# Patient Record
Sex: Male | Born: 1950 | Race: Black or African American | Hispanic: No | Marital: Married | Smoking: Former smoker
Health system: Southern US, Community
[De-identification: ages and names within clinical notes are randomized; demographics above are authoritative.]

## PROBLEM LIST (undated history)

## (undated) DIAGNOSIS — N19 Unspecified kidney failure: Secondary | ICD-10-CM

## (undated) DIAGNOSIS — N400 Enlarged prostate without lower urinary tract symptoms: Secondary | ICD-10-CM

## (undated) DIAGNOSIS — I10 Essential (primary) hypertension: Secondary | ICD-10-CM

## (undated) DIAGNOSIS — E785 Hyperlipidemia, unspecified: Secondary | ICD-10-CM

## (undated) HISTORY — PX: COLONOSCOPY: SHX174

## (undated) HISTORY — DX: Benign prostatic hyperplasia without lower urinary tract symptoms: N40.0

## (undated) HISTORY — DX: Essential (primary) hypertension: I10

## (undated) HISTORY — PX: ACHILLES TENDON REPAIR: SUR1153

## (undated) HISTORY — DX: Unspecified kidney failure: N19

## (undated) HISTORY — DX: Hyperlipidemia, unspecified: E78.5

---

## 2002-09-01 ENCOUNTER — Ambulatory Visit (HOSPITAL_COMMUNITY): Admission: RE | Admit: 2002-09-01 | Discharge: 2002-09-01 | Payer: Self-pay | Admitting: Family Medicine

## 2002-09-01 ENCOUNTER — Encounter: Payer: Self-pay | Admitting: Family Medicine

## 2014-02-03 ENCOUNTER — Other Ambulatory Visit (HOSPITAL_COMMUNITY): Payer: Self-pay | Admitting: Urology

## 2014-02-03 DIAGNOSIS — C61 Malignant neoplasm of prostate: Secondary | ICD-10-CM

## 2014-02-22 ENCOUNTER — Ambulatory Visit (HOSPITAL_COMMUNITY): Admission: RE | Admit: 2014-02-22 | Payer: BC Managed Care – PPO | Source: Ambulatory Visit

## 2014-02-28 ENCOUNTER — Ambulatory Visit (HOSPITAL_COMMUNITY)
Admission: RE | Admit: 2014-02-28 | Discharge: 2014-02-28 | Disposition: A | Discharge status: Home / Self Care | Payer: BC Managed Care – PPO | Source: Ambulatory Visit | Attending: Urology | Admitting: Urology

## 2014-02-28 DIAGNOSIS — C61 Malignant neoplasm of prostate: Secondary | ICD-10-CM

## 2014-02-28 IMAGING — MR MR PROSTATE WO/W CM
49 of 54 series · 49 of 54 positions shown · IV contrast (multihance)
Comparison: Noncontrast CT on 09/20/2013

CLINICAL DATA: Recent TURP procedure revealed prostate carcinoma.
Gleason score 6.

EXAM:
MR PROSTATE WITHOUT AND WITH CONTRAST
TECHNIQUE: Multiplanar multisequence MRI images were obtained of the pelvis
centered about the prostate. Pre and post contrast images were
obtained.
CONTRAST:  20mL MULTIHANCE GADOBENATE DIMEGLUMINE 529 MG/ML IV SOLN

[Series 3: T1 · axial · 8.0mm · 0.78mm/px · 1 of 22 slices shown (1 of 2)]
[im 1/22]
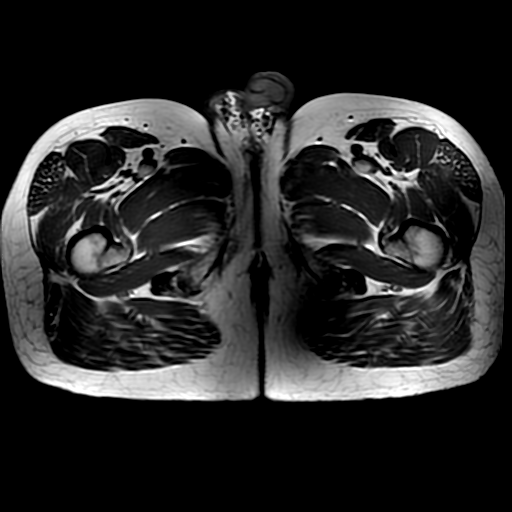

[Series 4: T2 · sagittal · 3.0mm · 0.35mm/px · 1 of 26 slices shown (1 of 3)]
[im 1/26]
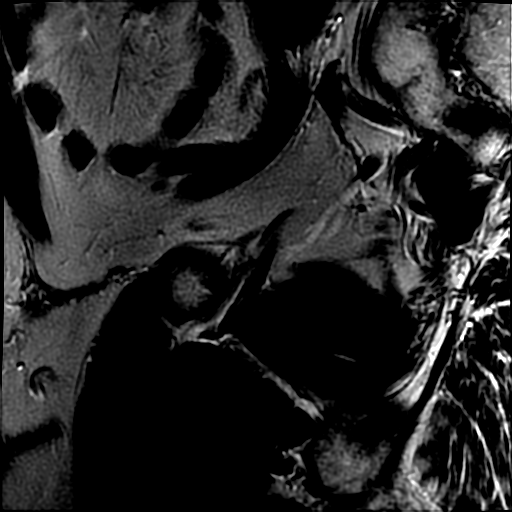

[Series 5: T1 · axial · 3.0mm · 0.33mm/px · 1 of 24 slices shown (2 of 2)]
[im 1/24]
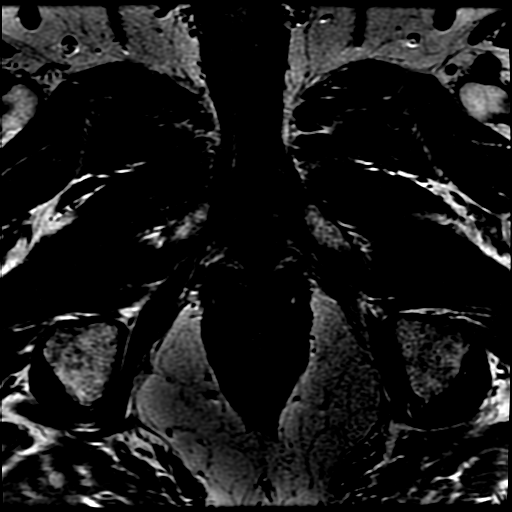

[Series 6: T2 · axial · 3.0mm · 0.33mm/px · 1 of 24 slices shown (2 of 3)]
[im 1/24]
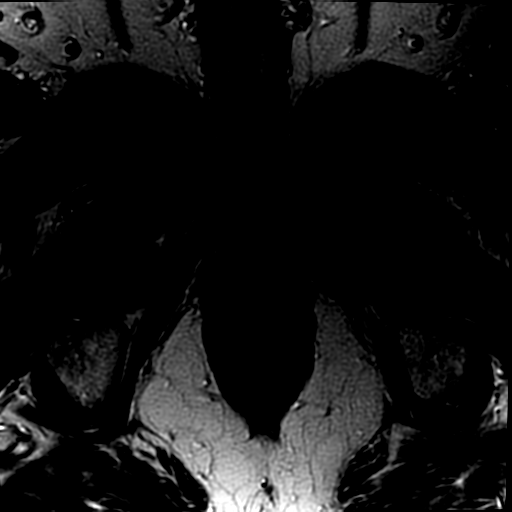

[Series 8: DWI · axial · 3.0mm · 0.70mm/px · 1 of 60 slices shown (1 of 2)]
[im 1/60]
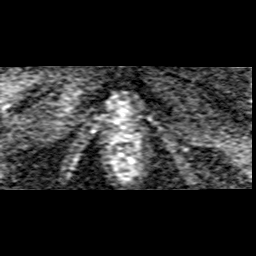

[Series 9: T2 · coronal · 3.0mm · 0.35mm/px · 1 of 18 slices shown (3 of 3)]
[im 1/18]
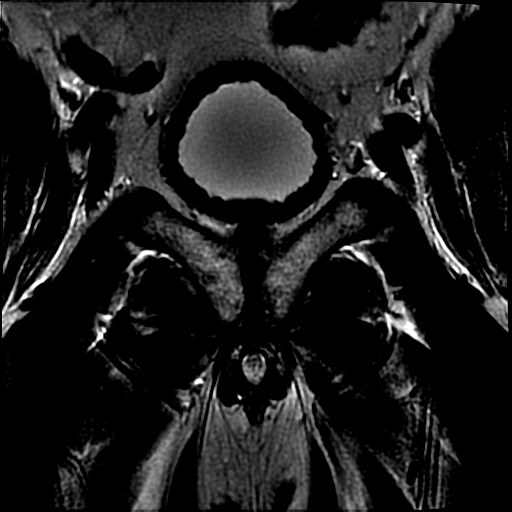

[Series 10: bSSFP fat-sat · axial · 8.0mm · 0.78mm/px · 1 of 22 slices shown]
[im 1/22]
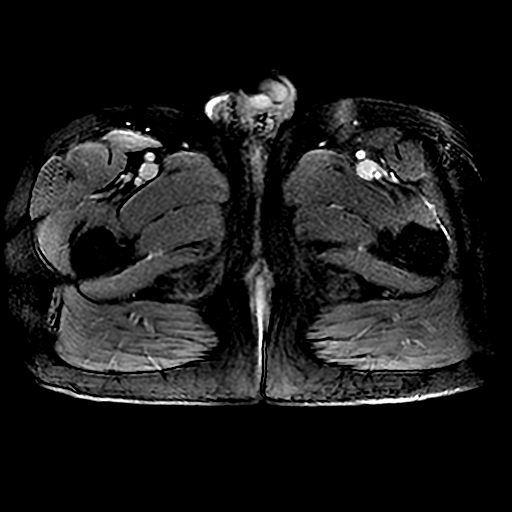

[Series 800: DWI · axial · 3.0mm · 0.70mm/px · 1 of 2 slices shown (2 of 2)]
[im 1/2]
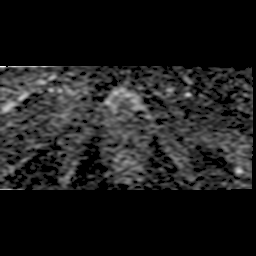

[Series 1100: T1 dynamic · axial · 4.0mm · 0.94mm/px · 1 of 26 slices shown (1 of 24)]
[im 1/26]
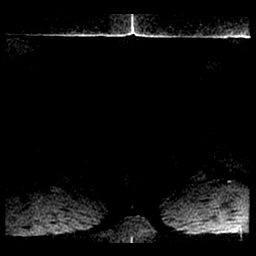

[Series 1101: T1 dynamic · axial · 4.0mm · 0.94mm/px · 1 of 26 slices shown (2 of 24)]
[im 1/26]
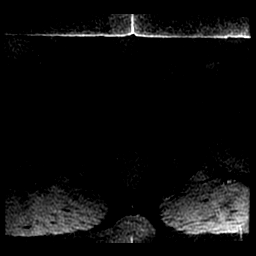

[Series 1102: T1 dynamic · axial · 4.0mm · 0.94mm/px · 1 of 26 slices shown (3 of 24)]
[im 1/26]
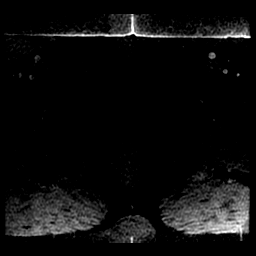

[Series 1103: T1 dynamic · axial · 4.0mm · 0.94mm/px · 1 of 26 slices shown (4 of 24)]
[im 1/26]
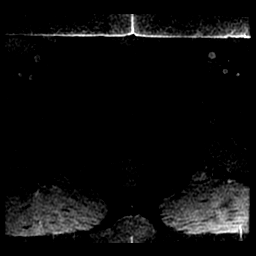

[Series 1104: T1 dynamic · axial · 4.0mm · 0.94mm/px · 1 of 26 slices shown (5 of 24)]
[im 1/26]
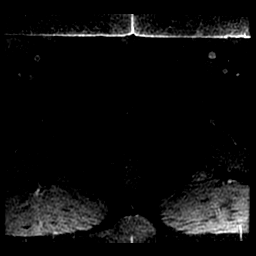

[Series 1105: T1 dynamic · axial · 4.0mm · 0.94mm/px · 1 of 26 slices shown (6 of 24)]
[im 1/26]
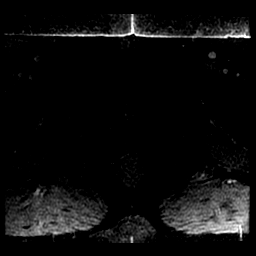

[Series 1106: T1 dynamic · axial · 4.0mm · 0.94mm/px · 1 of 26 slices shown (7 of 24)]
[im 1/26]
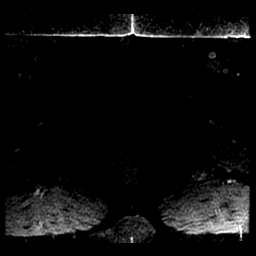

[Series 1107: T1 dynamic · axial · 4.0mm · 0.94mm/px · 1 of 26 slices shown (8 of 24)]
[im 1/26]
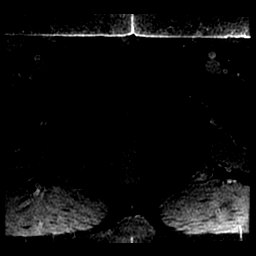

[Series 1108: T1 dynamic · axial · 4.0mm · 0.94mm/px · 1 of 26 slices shown (9 of 24)]
[im 1/26]
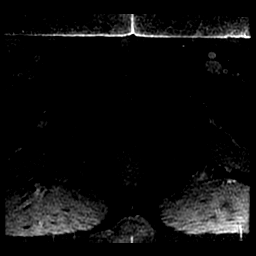

[Series 1109: T1 dynamic · axial · 4.0mm · 0.94mm/px · 1 of 26 slices shown (10 of 24)]
[im 1/26]
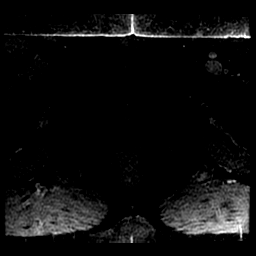

[Series 1110: T1 dynamic · axial · 4.0mm · 0.94mm/px · 1 of 26 slices shown (11 of 24)]
[im 1/26]
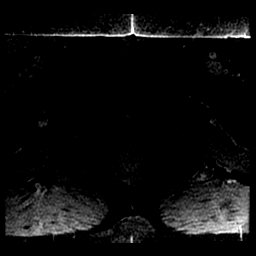

[Series 1111: T1 dynamic · axial · 4.0mm · 0.94mm/px · 1 of 26 slices shown (12 of 24)]
[im 1/26]
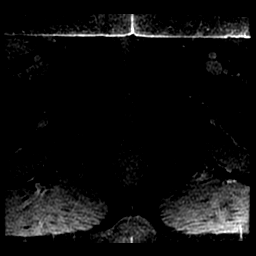

[Series 1112: T1 dynamic · axial · 4.0mm · 0.94mm/px · 1 of 26 slices shown (13 of 24)]
[im 1/26]
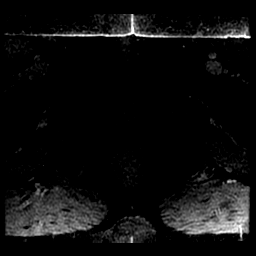

[Series 1113: T1 dynamic · axial · 4.0mm · 0.94mm/px · 1 of 26 slices shown (14 of 24)]
[im 1/26]
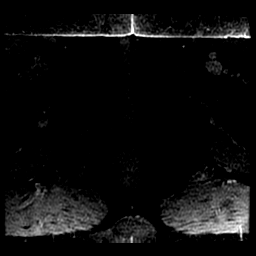

[Series 1114: T1 dynamic · axial · 4.0mm · 0.94mm/px · 1 of 26 slices shown (15 of 24)]
[im 1/26]
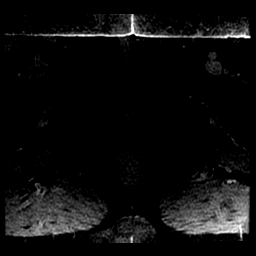

[Series 1115: T1 dynamic · axial · 4.0mm · 0.94mm/px · 1 of 26 slices shown (16 of 24)]
[im 1/26]
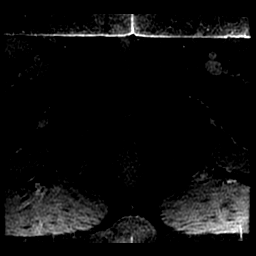

[Series 1116: T1 dynamic · axial · 4.0mm · 0.94mm/px · 1 of 26 slices shown (17 of 24)]
[im 1/26]
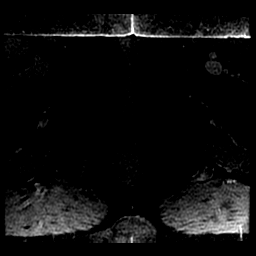

[Series 1117: T1 dynamic · axial · 4.0mm · 0.94mm/px · 1 of 26 slices shown (18 of 24)]
[im 1/26]
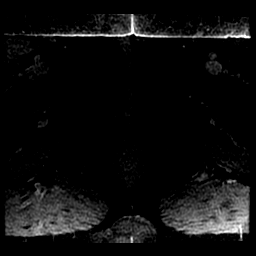

[Series 1118: T1 dynamic · axial · 4.0mm · 0.94mm/px · 1 of 26 slices shown (19 of 24)]
[im 1/26]
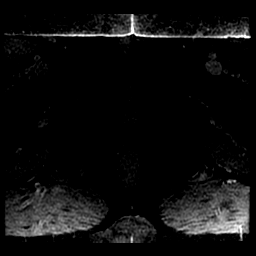

[Series 1119: T1 dynamic · axial · 4.0mm · 0.94mm/px · 1 of 26 slices shown (20 of 24)]
[im 1/26]
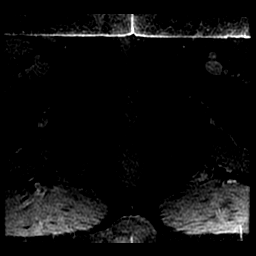

[Series 1120: T1 dynamic · axial · 4.0mm · 0.94mm/px · 1 of 26 slices shown (21 of 24)]
[im 1/26]
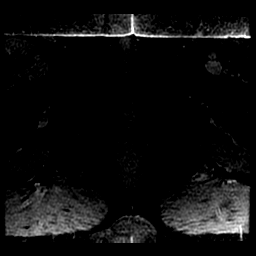

[Series 1121: T1 dynamic · axial · 4.0mm · 0.94mm/px · 1 of 26 slices shown (22 of 24)]
[im 1/26]
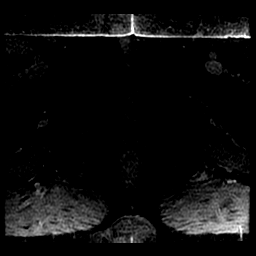

[Series 1122: T1 dynamic · axial · 4.0mm · 0.94mm/px · 1 of 26 slices shown (23 of 24)]
[im 1/26]
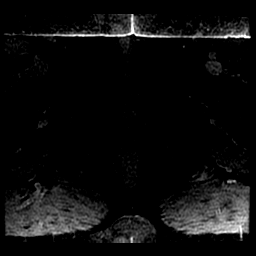

[Series 1123: T1 dynamic · axial · 4.0mm · 0.94mm/px · 1 of 26 slices shown (24 of 24)]
[im 1/26]
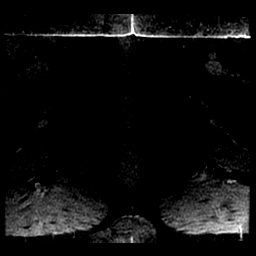

[((date))-((date)) · axial · 4.0mm · 0.94mm/px · 1 of 1 slices shown (1 of 17)]
[im 1/1]
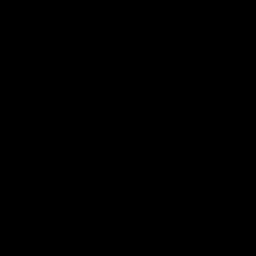

[((date))-((date)) · axial · 4.0mm · 0.94mm/px · 1 of 14 slices shown (2 of 17)]
[im 1/14]
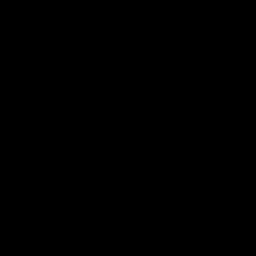

[((date))-((date)) · axial · 4.0mm · 0.94mm/px · 1 of 20 slices shown (3 of 17)]
[im 1/20]
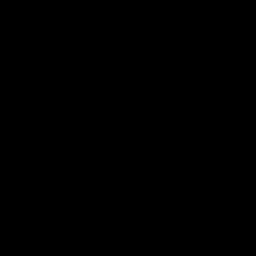

[((date))-((date)) · axial · 4.0mm · 0.94mm/px · 1 of 20 slices shown (4 of 17)]
[im 1/20]
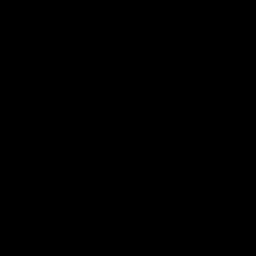

[((date))-((date)) · axial · 4.0mm · 0.94mm/px · 1 of 24 slices shown (5 of 17)]
[im 1/24]
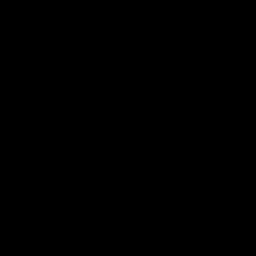

[((date))-((date)) · axial · 4.0mm · 0.94mm/px · 1 of 25 slices shown (6 of 17)]
[im 1/25]
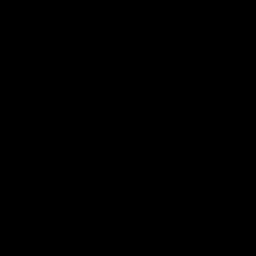

[((date))-((date)) · axial · 4.0mm · 0.94mm/px · 1 of 26 slices shown (7 of 17)]
[im 1/26]
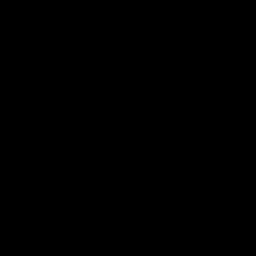

[((date))-((date)) · axial · 4.0mm · 0.94mm/px · 1 of 26 slices shown (8 of 17)]
[im 1/26]
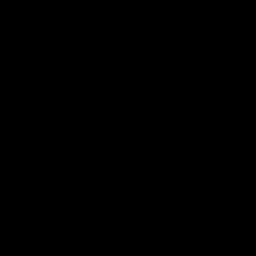

[((date))-((date)) · axial · 4.0mm · 0.94mm/px · 1 of 26 slices shown (9 of 17)]
[im 1/26]
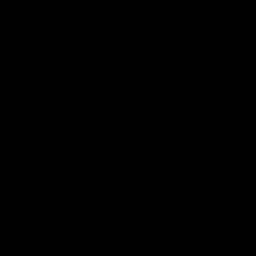

[((date))-((date)) · axial · 4.0mm · 0.94mm/px · 1 of 26 slices shown (10 of 17)]
[im 1/26]
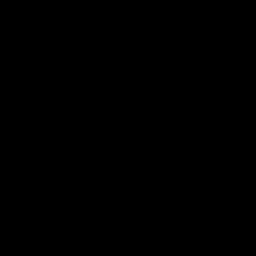

[((date))-((date)) · axial · 4.0mm · 0.94mm/px · 1 of 26 slices shown (11 of 17)]
[im 1/26]
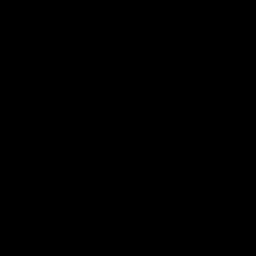

[((date))-((date)) · axial · 4.0mm · 0.94mm/px · 1 of 26 slices shown (12 of 17)]
[im 1/26]
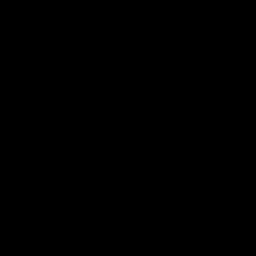

[((date))-((date)) · axial · 4.0mm · 0.94mm/px · 1 of 26 slices shown (13 of 17)]
[im 1/26]
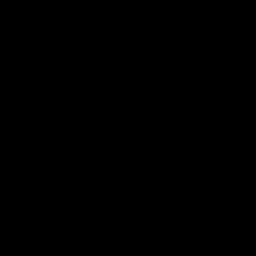

[((date))-((date)) · axial · 4.0mm · 0.94mm/px · 1 of 26 slices shown (14 of 17)]
[im 1/26]
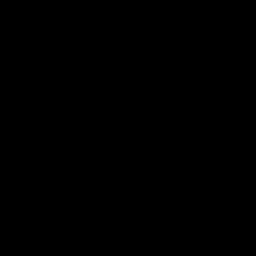

[((date))-((date)) · axial · 4.0mm · 0.94mm/px · 1 of 26 slices shown (15 of 17)]
[im 1/26]
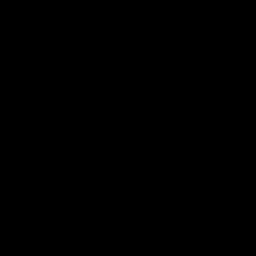

[((date))-((date)) · axial · 4.0mm · 0.94mm/px · 1 of 26 slices shown (16 of 17)]
[im 1/26]
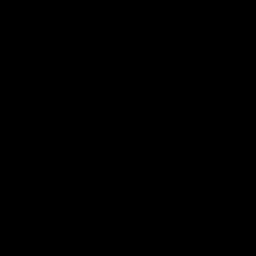

[((date))-((date)) · axial · 4.0mm · 0.94mm/px · 1 of 26 slices shown (17 of 17)]
[im 1/26]
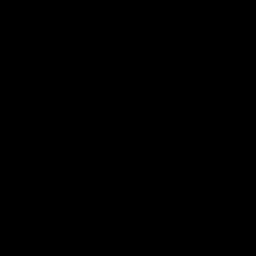

[49 of 54 positions shown; findings below may reference images not displayed]

FINDINGS: Prostate: Normal size.  Large TURP defect centrally.

A 7 by 11 mm T2 hypointense nodule is seen in the central posterior
peripheral zone at the prostatic base (image 16 of series 6). This
nodular shows no evidence of restricted diffusion or
hypervascularity, and is most likely due to chronic scarring/postop
change. No other focal prostate nodules seen.

Transcapsular spread:  None

Seminal vesicle involvement: None

Neurovascular bundle involvement: None

Pelvic adenopathy: None

Bone metastasis: None

Other findings: Mild diffuse bladder wall thickening, consistent
with muscular hypertrophy.
IMPRESSION: Normal size prostate with large TURP defect. 11 mm nodule in the
central posterior peripheral zone of the prostatic base is likely
due to chronic scarring/postop change. No radiographic evidence of
clinically significant macroscopic prostate carcinoma or
extracapsular extension.

## 2014-02-28 MED ORDER — GADOBENATE DIMEGLUMINE 529 MG/ML IV SOLN
20.0000 mL | Freq: Once | INTRAVENOUS | Status: AC
  Administered 2014-02-28: 20 mL via INTRAVENOUS

## 2014-03-21 ENCOUNTER — Encounter: Payer: Self-pay | Admitting: Internal Medicine

## 2014-08-26 ENCOUNTER — Encounter: Payer: Self-pay | Admitting: Internal Medicine

## 2014-11-15 ENCOUNTER — Encounter: Payer: Self-pay | Admitting: Internal Medicine

## 2015-02-01 ENCOUNTER — Ambulatory Visit (AMBULATORY_SURGERY_CENTER): Payer: Self-pay

## 2015-02-01 ENCOUNTER — Encounter (INDEPENDENT_AMBULATORY_CARE_PROVIDER_SITE_OTHER): Payer: Self-pay

## 2015-02-01 VITALS — Ht 72.0 in | Wt 241.4 lb

## 2015-02-01 DIAGNOSIS — Z1211 Encounter for screening for malignant neoplasm of colon: Secondary | ICD-10-CM

## 2015-02-01 NOTE — Progress Notes (Signed)
No allergies to eggs or soy No diet/weight loss meds No home oxygen No past problems with anesthesia  Has email  Emmi instructions given for colonoscopy 

## 2015-02-15 ENCOUNTER — Ambulatory Visit (AMBULATORY_SURGERY_CENTER): Payer: Federal, State, Local not specified - PPO | Admitting: Internal Medicine

## 2015-02-15 ENCOUNTER — Encounter: Payer: Self-pay | Admitting: Internal Medicine

## 2015-02-15 VITALS — BP 105/59 | HR 64 | Temp 96.6°F | Resp 16 | Ht 72.0 in | Wt 241.0 lb

## 2015-02-15 DIAGNOSIS — K621 Rectal polyp: Secondary | ICD-10-CM

## 2015-02-15 DIAGNOSIS — D129 Benign neoplasm of anus and anal canal: Secondary | ICD-10-CM

## 2015-02-15 DIAGNOSIS — D128 Benign neoplasm of rectum: Secondary | ICD-10-CM

## 2015-02-15 DIAGNOSIS — Z1211 Encounter for screening for malignant neoplasm of colon: Secondary | ICD-10-CM | POA: Diagnosis present

## 2015-02-15 MED ORDER — SODIUM CHLORIDE 0.9 % IV SOLN: 500.0000 mL | INTRAVENOUS | Status: DC

## 2015-02-15 NOTE — Patient Instructions (Addendum)
I found and removed 3 tiny polyps. I will let you know pathology results and when to have another routine colonoscopy by mail. All else ok  I appreciate the opportunity to care for you. Gatha Mayer, MD, FACG   YOU HAD AN ENDOSCOPIC PROCEDURE TODAY AT [facility]:   Refer to the procedure report that was given to you for any specific questions about what was found during the examination.  If the procedure report does not answer your questions, please call your gastroenterologist to clarify.  If you requested that your care partner not be given the details of your procedure findings, then the procedure report has been included in a sealed envelope for you to review at your convenience later.  YOU SHOULD EXPECT: Some feelings of bloating in the abdomen. Passage of more gas than usual.  Walking can help get rid of the air that was put into your GI tract during the procedure and reduce the bloating. If you had a lower endoscopy (such as a colonoscopy or flexible sigmoidoscopy) you may notice spotting of blood in your stool or on the toilet paper. If you underwent a bowel prep for your procedure, you may not have a normal bowel movement for a few days.  Please Note:  You might notice some irritation and congestion in your nose or some drainage.  This is from the oxygen used during your procedure.  There is no need for concern and it should clear up in a day or so.  SYMPTOMS TO REPORT IMMEDIATELY:   Following lower endoscopy (colonoscopy or flexible sigmoidoscopy):  Excessive amounts of blood in the stool  Significant tenderness or worsening of abdominal pains  Swelling of the abdomen that is new, acute  Fever of 100F or higher  For urgent or emergent issues, a gastroenterologist can be reached at any hour by calling [phone].   DIET: Your first meal following the procedure should be a small meal and then it is ok to progress to your normal diet. Heavy or fried  foods are harder to digest and may make you feel nauseous or bloated.  Likewise, meals heavy in dairy and vegetables can increase bloating.  Drink plenty of fluids but you should avoid alcoholic beverages for 24 hours.  ACTIVITY:  You should plan to take it easy for the rest of today and you should NOT DRIVE or use heavy machinery until tomorrow (because of the sedation medicines used during the test).    FOLLOW UP: Our staff will call the number listed on your records the next business day following your procedure to check on you and address any questions or concerns that you may have regarding the information given to you following your procedure. If we do not reach you, we will leave a message.  However, if you are feeling well and you are not experiencing any problems, there is no need to return our call.  We will assume that you have returned to your regular daily activities without incident.  If any biopsies were taken you will be contacted by phone or by letter within the next 1-3 weeks.  Please call us at [phone] if you have not heard about the biopsies in 3 weeks.    SIGNATURES/CONFIDENTIALITY: You and/or your care partner have signed paperwork which will be entered into your electronic medical record.  These signatures attest to the fact that that the information above on your After Visit Summary has been reviewed and is  understood.  Full responsibility of the confidentiality of this discharge information lies with you and/or your care-partner.

## 2015-02-15 NOTE — Progress Notes (Signed)
Called to room to assist during endoscopic procedure.  Patient ID and intended procedure confirmed with present staff. Received instructions for my participation in the procedure from the performing physician.  

## 2015-02-15 NOTE — Progress Notes (Signed)
Stable to RR 

## 2015-02-15 NOTE — Progress Notes (Signed)
Pt received 500 cc prior to coming to procedure room.

## 2015-02-15 NOTE — Op Note (Signed)
[place]  [address] [place], [id number]   COLONOSCOPY PROCEDURE REPORT  PATIENT: John Levine, John Levine  MR#: [id number] BIRTHDATE: 08/17/1951 , 11  yrs. old GENDER: male ENDOSCOPIST: Gatha Mayer, MD, [facility] PROCEDURE DATE:  02/15/2015 PROCEDURE:   Colonoscopy, screening and Colonoscopy with snare polypectomy First Screening Colonoscopy - Avg.  risk and is 50 yrs.  old or older - No.  Prior Negative Screening - Now for repeat screening. 10 or more years since last screening  History of Adenoma - Now for follow-up colonoscopy & has been > or = to 3 yrs.  N/A  Polyps removed today? Yes Polyps Removed Today ASA CLASS:   Class III INDICATIONS:Screening for colonic neoplasia and Colorectal Neoplasm Risk Assessment for this procedure is average risk. MEDICATIONS: Propofol 400 mg IV, Monitored anesthesia care, and Lidocaine 40 mg IV  DESCRIPTION OF PROCEDURE:   After the risks benefits and alternatives of the procedure were thoroughly explained, informed consent was obtained.  The digital rectal exam revealed no abnormalities of the rectum, revealed no prostatic nodules, and revealed the prostate was not enlarged.   The [id number] endoscope was introduced through the anus and advanced to the cecum, which was identified by both the appendix and ileocecal valve. No adverse events experienced.   The quality of the prep was adequate (MiraLax was used)  The instrument was then slowly withdrawn as the colon was fully examined. Estimated blood loss is zero unless otherwise noted in this procedure report.   COLON FINDINGS: Three sessile polyps ranging from 2 to 46m in size were found in the rectum.  Polypectomies were performed with a cold snare.  The resection was complete, the polyp tissue was completely retrieved and sent to histology.   The examination was otherwise normal.   Right colon retroflexion included.  Retroflexed views revealed no abnormalities. The  time to cecum = 1.2 Withdrawal time = 14.2   The scope was withdrawn and the procedure completed. COMPLICATIONS: There were no immediate complications.  ENDOSCOPIC IMPRESSION: 1.   Three sessile polyps ranging from 2 to 442min size were found in the rectum; polypectomies were performed with a cold snare 2.   The examination was otherwise normal - adeaute prep  RECOMMENDATIONS: Timing of repeat colonoscopy will be determined by pathology findings.  eSigned:  CaGatha MayerMD, FABryan [facility] Center6/29/2016 9:56 AM   cc: The Patient and W. RaSamara SnideMD

## 2015-02-16 ENCOUNTER — Telehealth: Payer: Self-pay | Admitting: Emergency Medicine

## 2015-02-16 NOTE — Telephone Encounter (Signed)
No identifier, left message f/u

## 2015-02-21 ENCOUNTER — Encounter: Payer: Self-pay | Admitting: Internal Medicine

## 2015-02-21 NOTE — Progress Notes (Signed)
Quick Note:  Distal hyperplastics - repeat colon screening 2026 ______

## 2015-09-05 ENCOUNTER — Ambulatory Visit
Admission: RE | Admit: 2015-09-05 | Discharge: 2015-09-05 | Disposition: A | Discharge status: Home / Self Care | Payer: Medicare Other | Source: Ambulatory Visit | Attending: Family Medicine | Admitting: Family Medicine

## 2015-09-05 ENCOUNTER — Other Ambulatory Visit: Payer: Self-pay | Admitting: Family Medicine

## 2015-09-05 DIAGNOSIS — R0602 Shortness of breath: Secondary | ICD-10-CM

## 2015-09-05 IMAGING — CR DG CHEST 2V
2 series · 2 of 2 positions shown · non-contrast
Comparison: None available;

CLINICAL DATA: Shortness of breath, history hypertension,
hyperlipidemia, renal failure, former smoker, history prostate
cancer and head/neck cancer per prior PET/CT exams

EXAM:
CHEST  2 VIEW

[w chest pa]
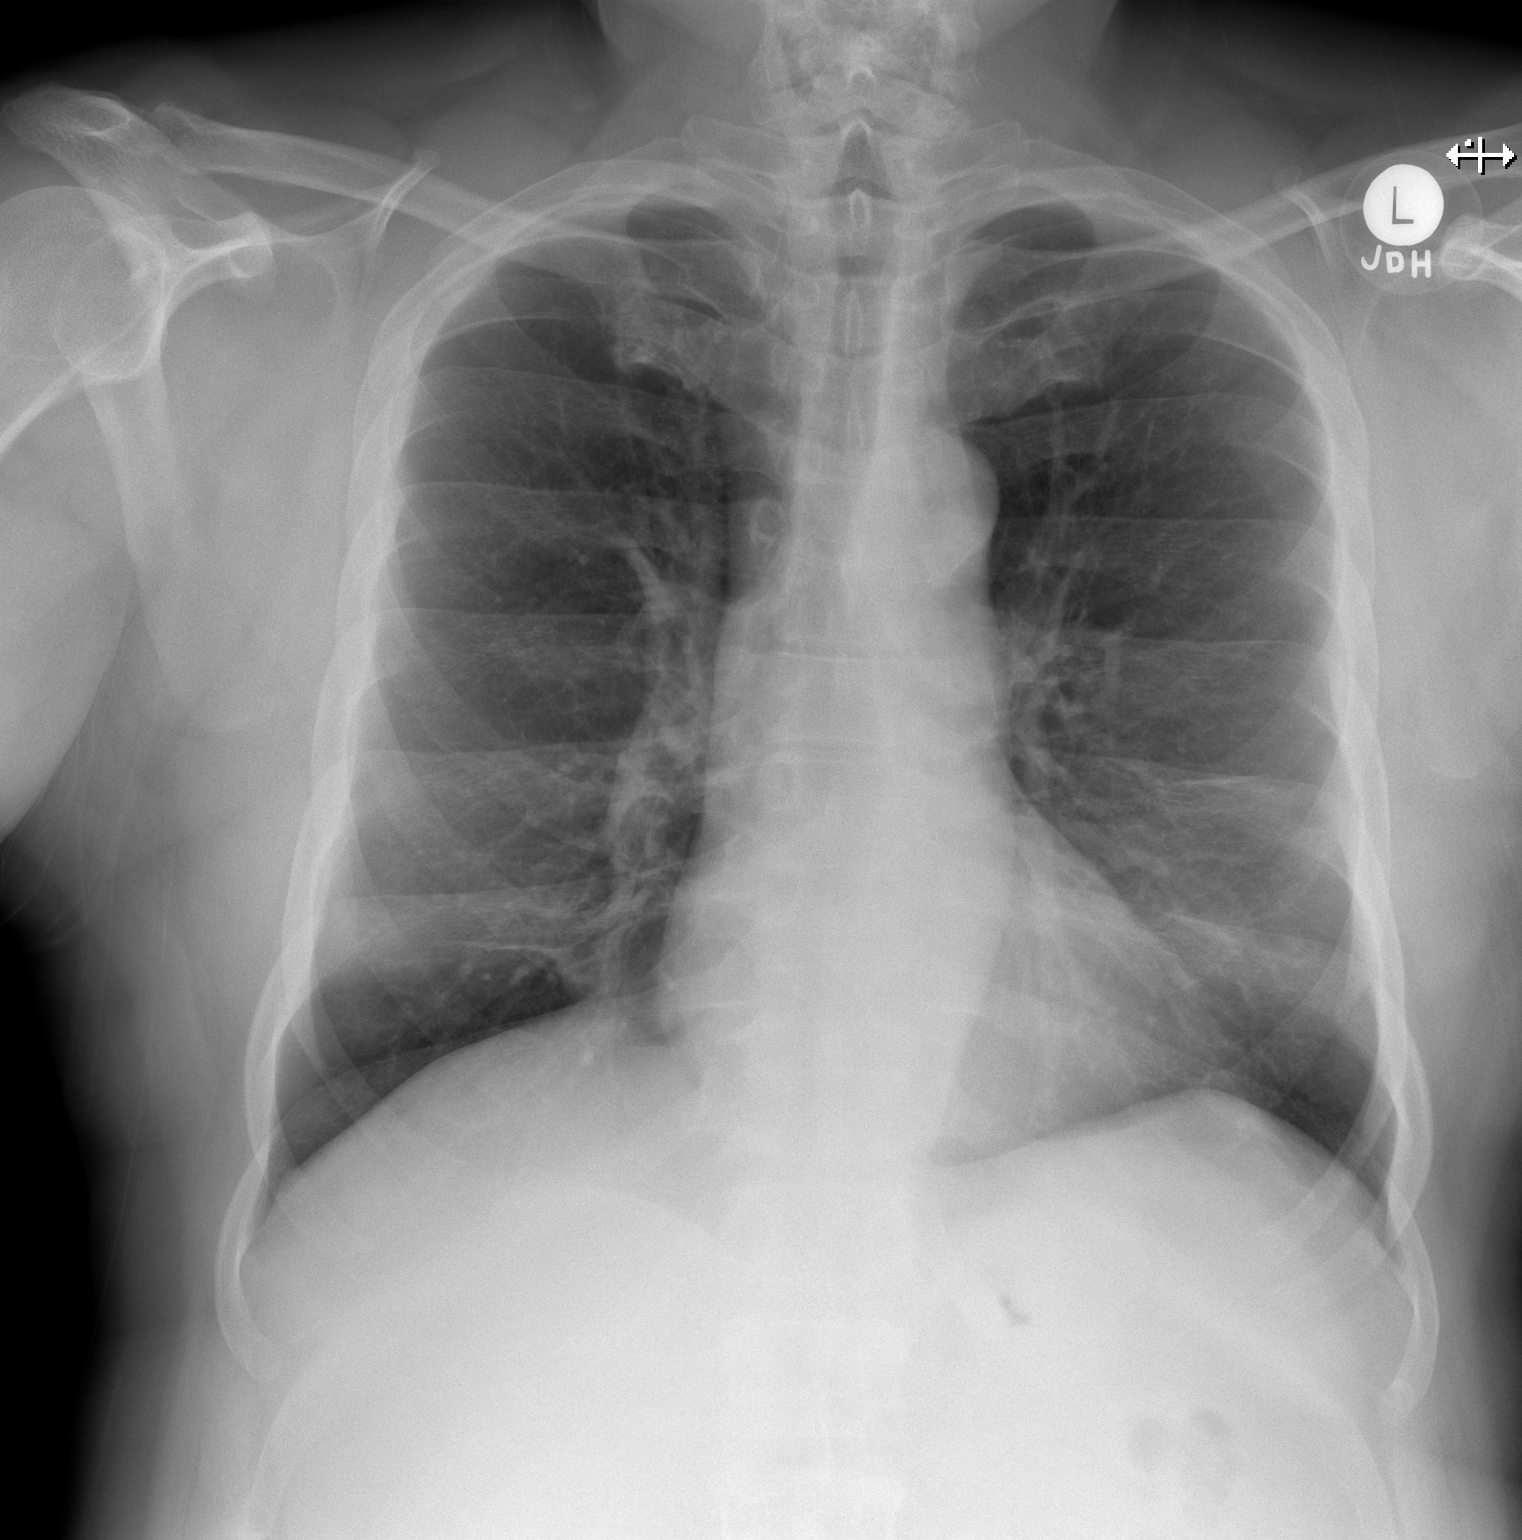

[w chest lat]
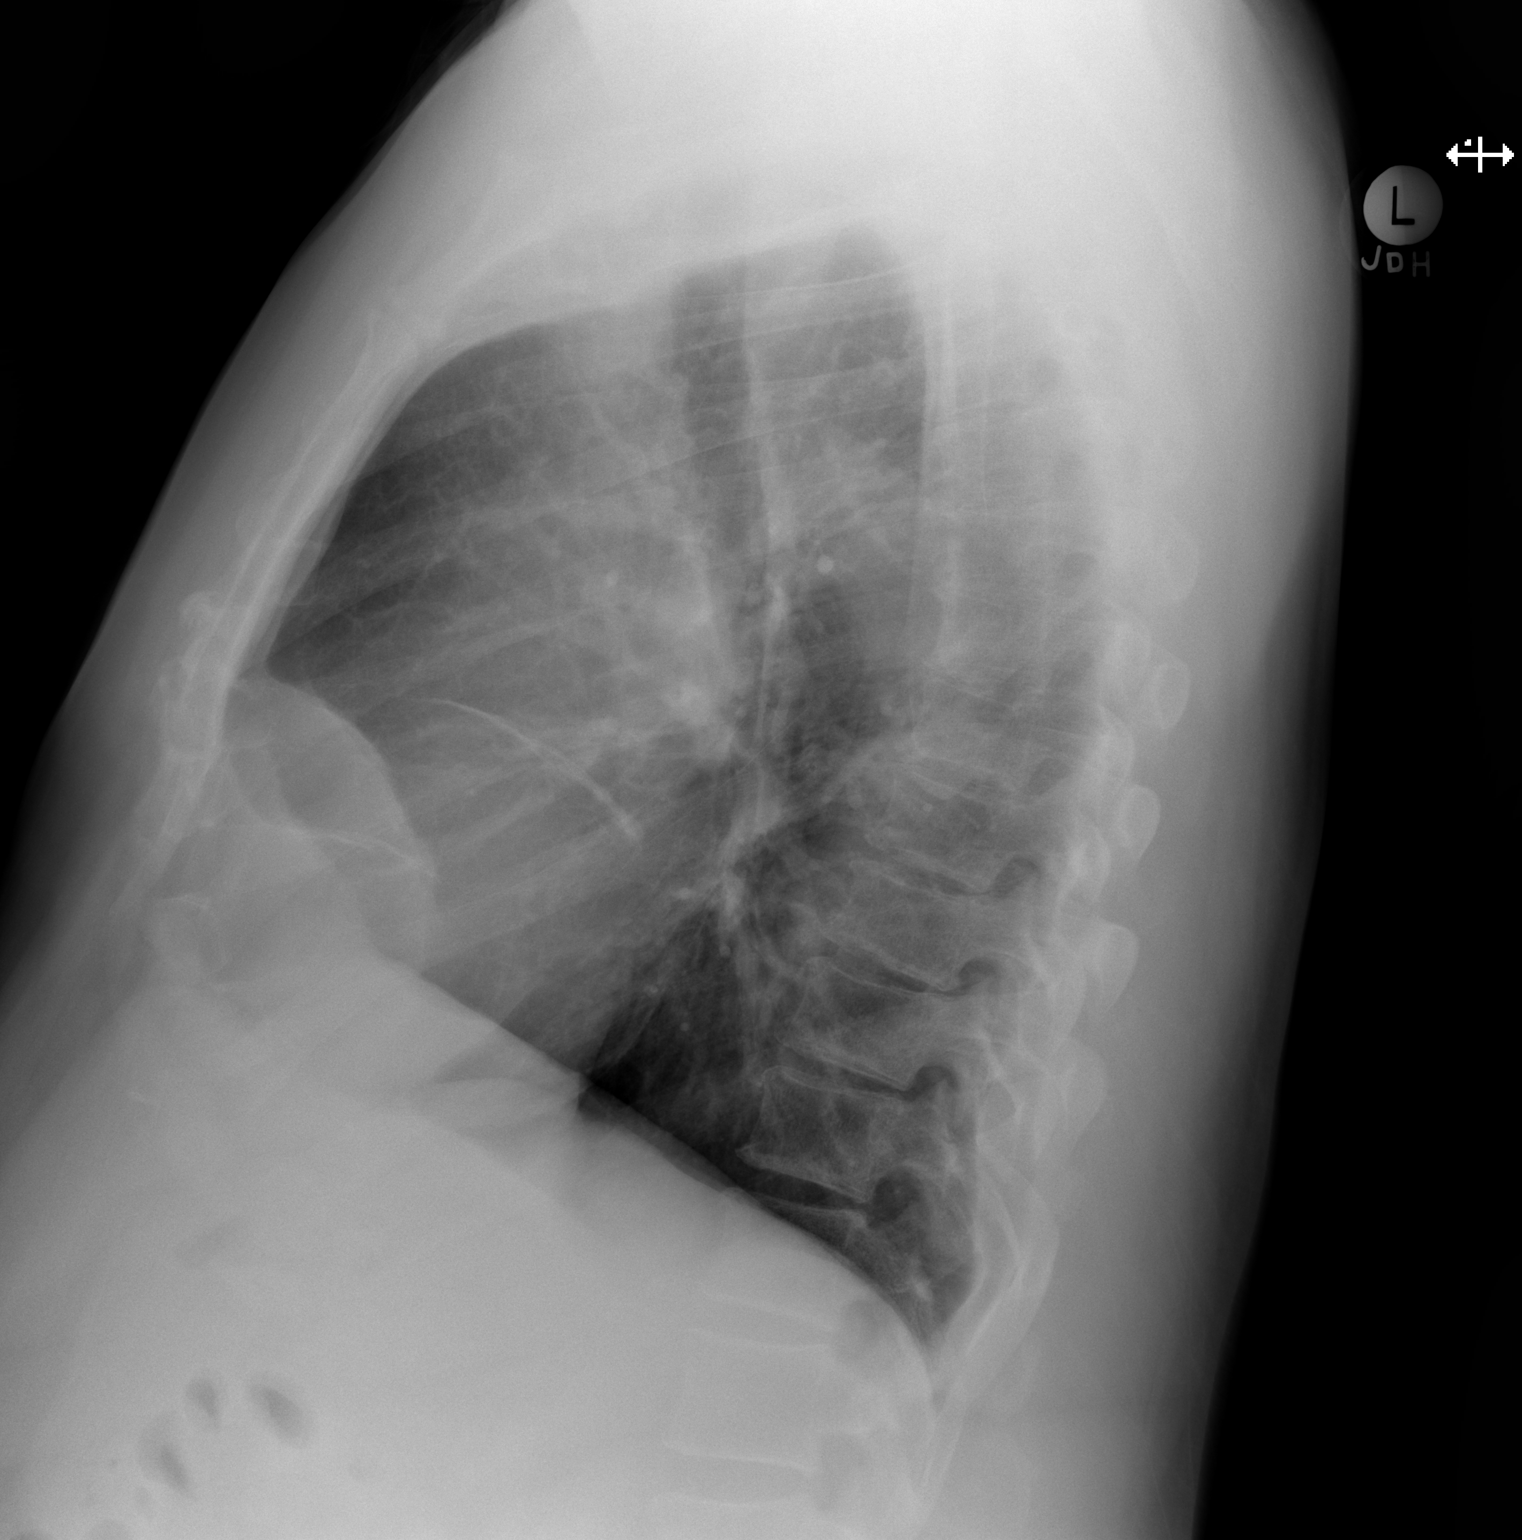

[2 of 2 positions shown; findings below may reference images not displayed]

prior chest radiograph listed on the
time line from 09/20/2013 does not load for comparison
FINDINGS: Normal heart size, mediastinal contours and pulmonary vascularity.

Lingular scarring versus subsegmental atelectasis.

Lungs otherwise clear.

No pleural effusion or pneumothorax.

Bones unremarkable.
IMPRESSION: Linear subsegmental atelectasis versus scarring in lingula.

Remainder of exam unremarkable.

## 2017-12-09 ENCOUNTER — Ambulatory Visit
Admission: RE | Admit: 2017-12-09 | Discharge: 2017-12-09 | Disposition: A | Discharge status: Home / Self Care | Payer: Medicare Other | Source: Ambulatory Visit | Attending: Family Medicine | Admitting: Family Medicine

## 2017-12-09 ENCOUNTER — Other Ambulatory Visit: Payer: Self-pay | Admitting: Family Medicine

## 2017-12-09 DIAGNOSIS — R0602 Shortness of breath: Secondary | ICD-10-CM

## 2017-12-09 IMAGING — CR DG CHEST 2V
2 series · 2 of 2 positions shown · non-contrast
Comparison: PA and lateral chest x-ray August 29, 2016

CLINICAL DATA: One month of shortness of breath without chest pain
or upper respiratory infection symptoms. History of hypertension,
former smoker.

EXAM:
CHEST - 2 VIEW

[w chest pa]
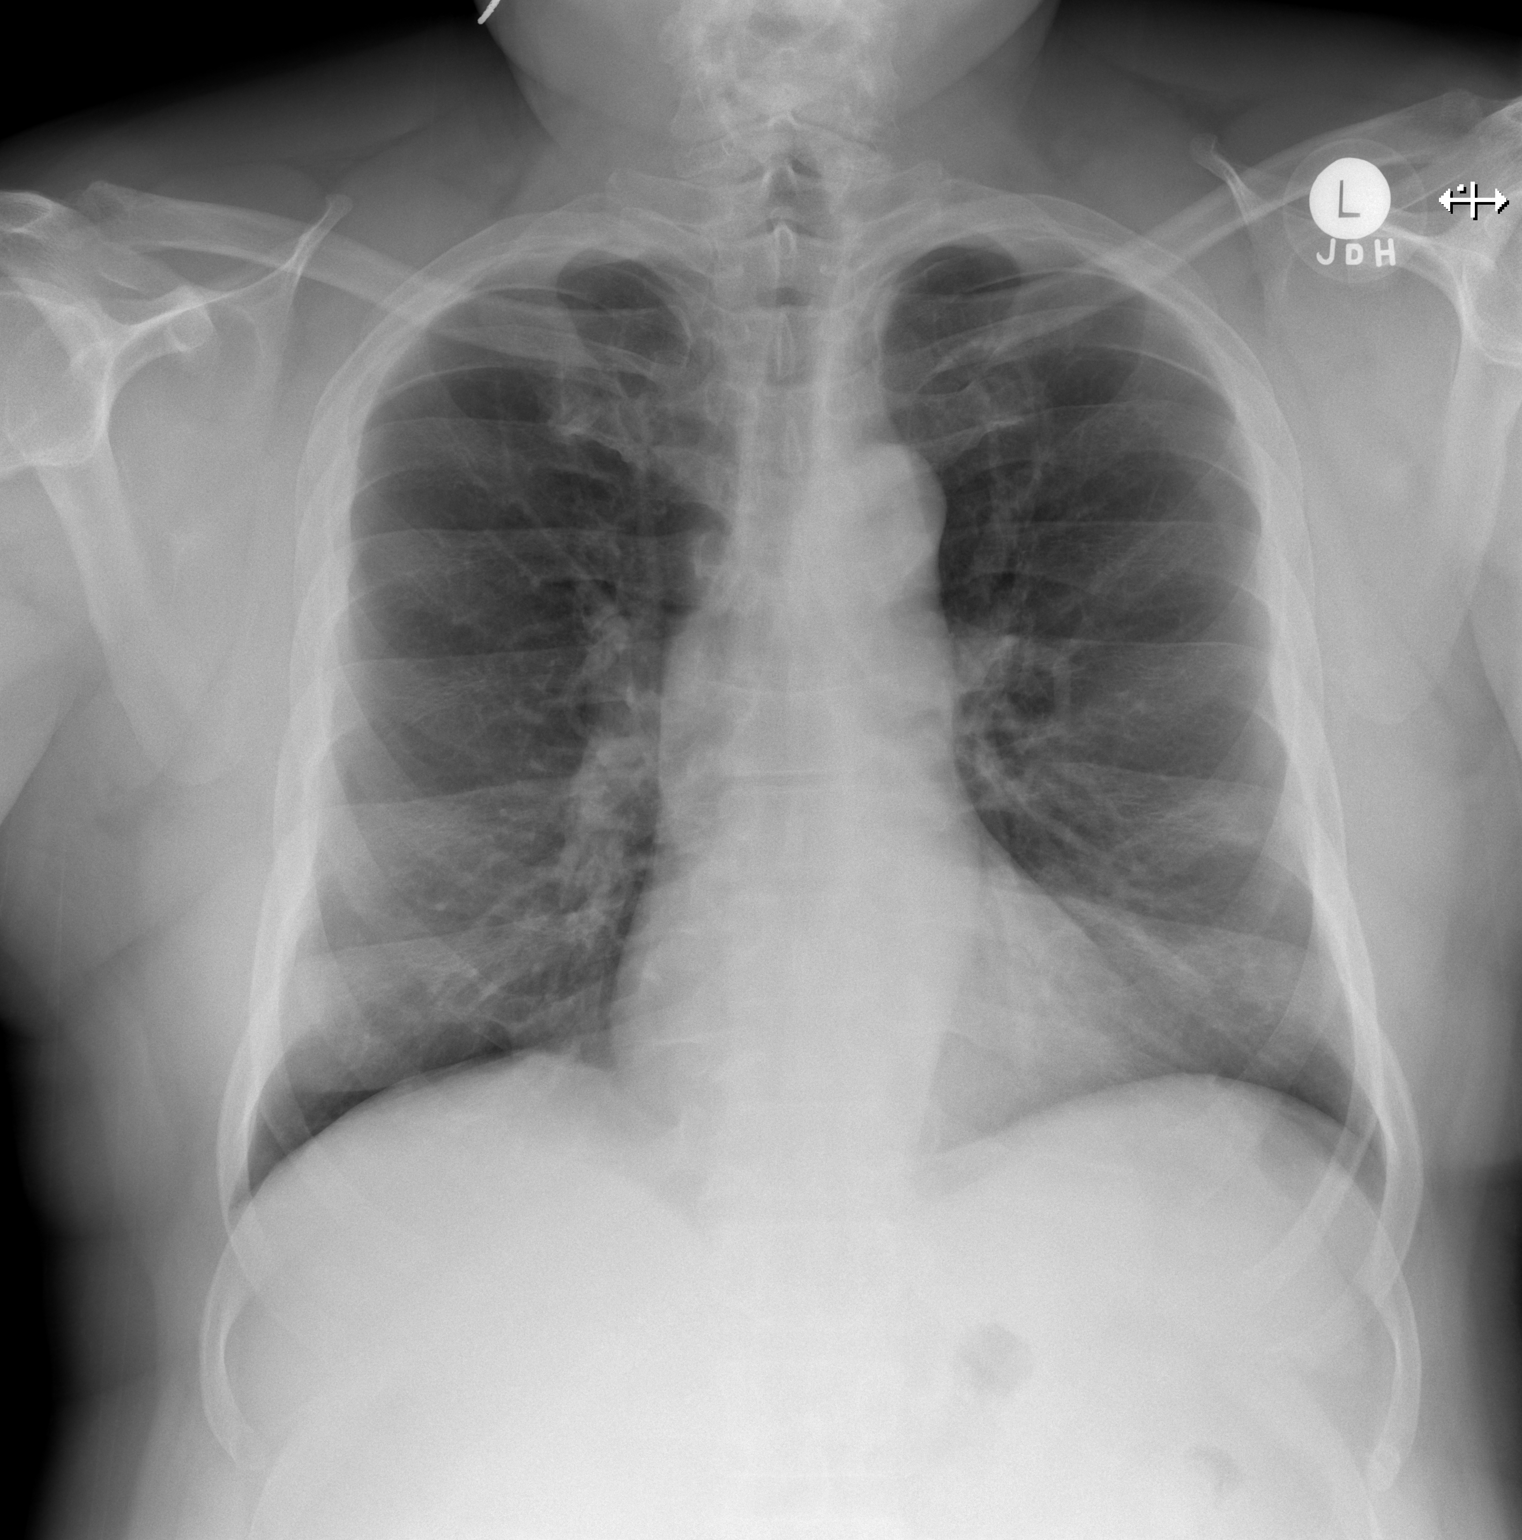

[w chest lat]
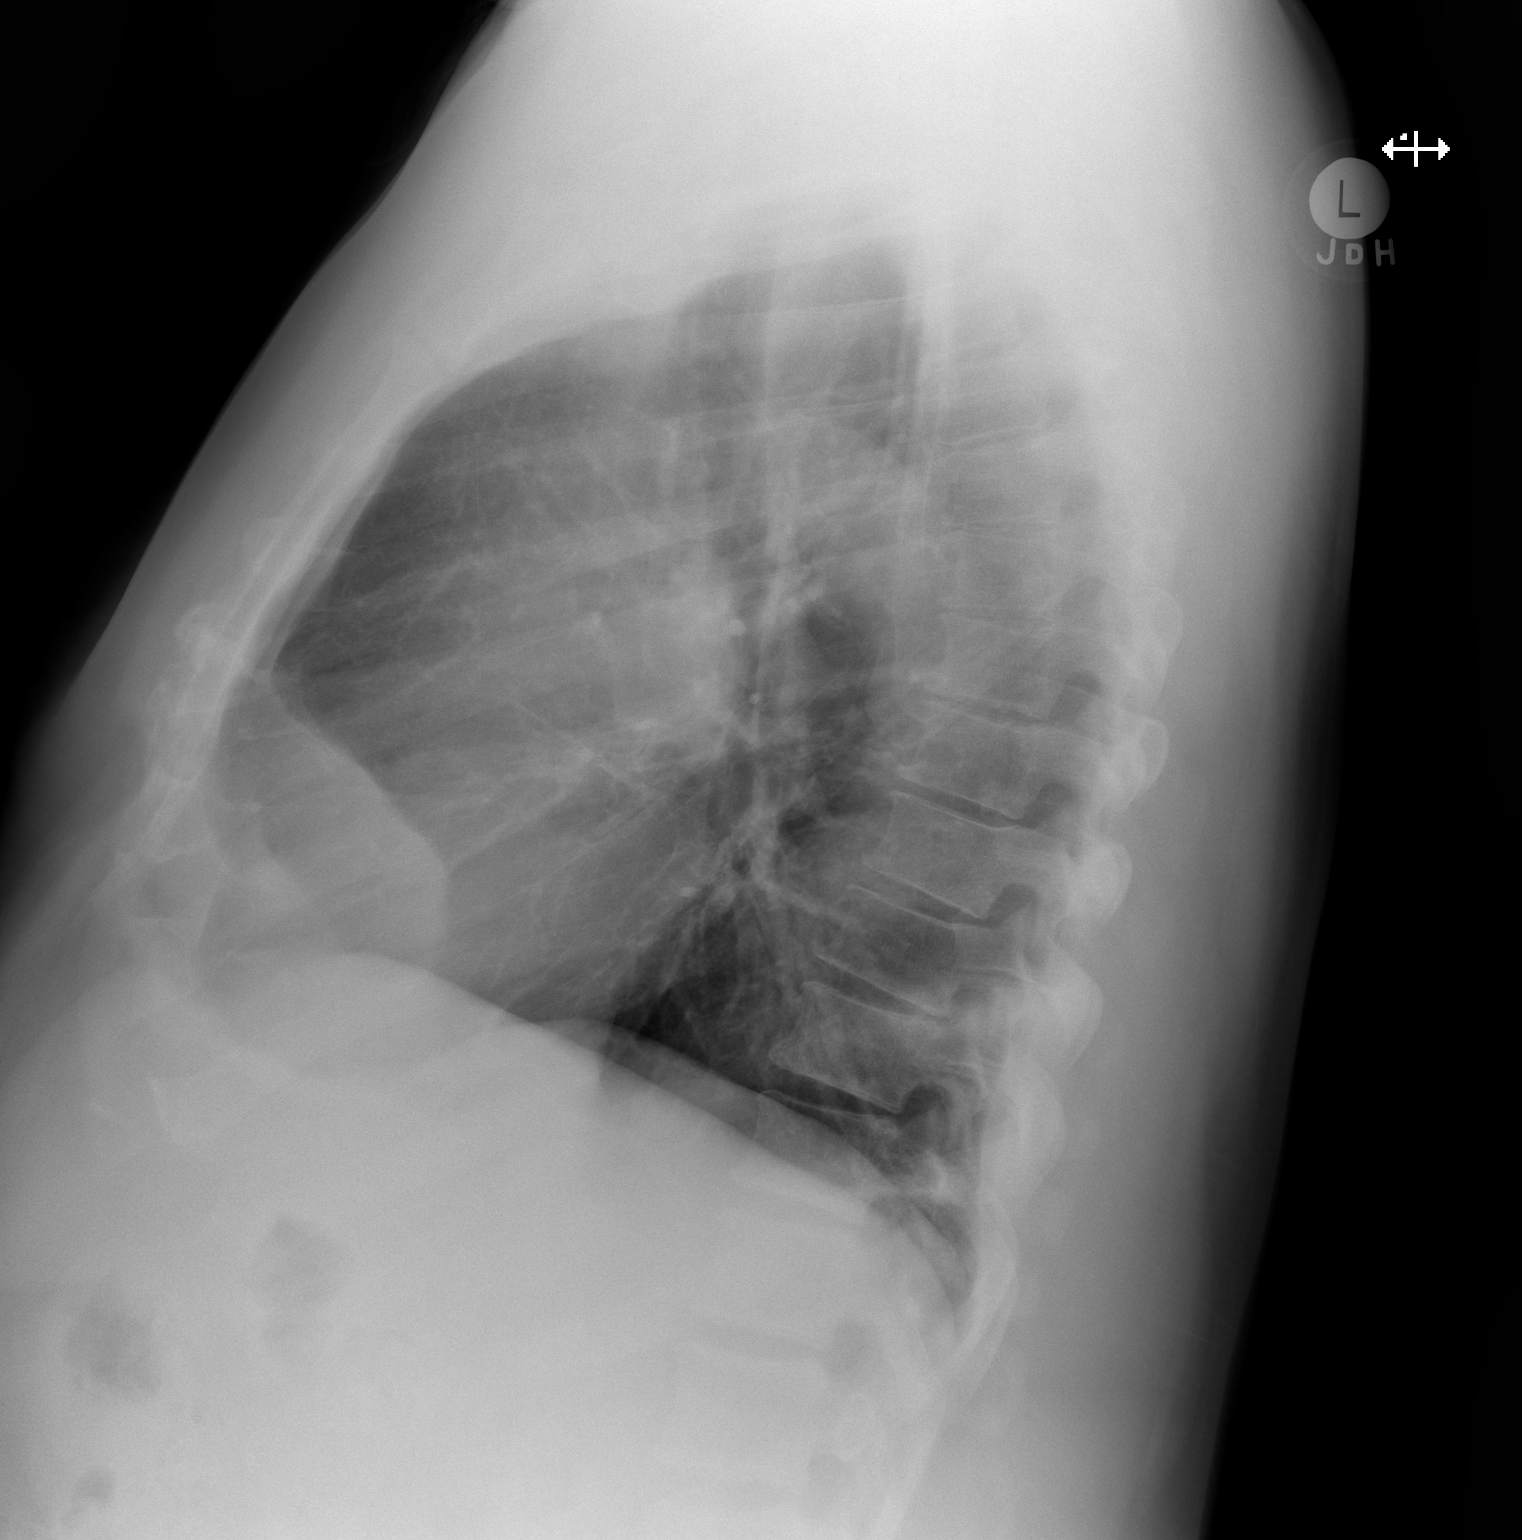

[2 of 2 positions shown; findings below may reference images not displayed]

FINDINGS: The lungs are adequately inflated. There is subtle increased density
in the lingula with partial obscuration of the left heart border
which is more conspicuous than in the past but not entirely new. The
heart and pulmonary vascularity are normal. There is calcification
in the wall of the aortic arch. There is no pleural effusion. The
bony thorax exhibits no acute abnormality.
IMPRESSION: Hazy increased density in the lingula suggests subsegmental
atelectasis or early pneumonia. Followup PA and lateral chest X-ray
is recommended in 3-4 weeks following trial of antibiotic therapy to
ensure resolution and exclude underlying malignancy.

Thoracic aortic atherosclerosis.

## 2018-02-04 ENCOUNTER — Ambulatory Visit
Admission: RE | Admit: 2018-02-04 | Discharge: 2018-02-04 | Disposition: A | Discharge status: Home / Self Care | Payer: Medicare Other | Source: Ambulatory Visit | Attending: Family Medicine | Admitting: Family Medicine

## 2018-02-04 ENCOUNTER — Other Ambulatory Visit: Payer: Self-pay | Admitting: Family Medicine

## 2018-02-04 DIAGNOSIS — Z09 Encounter for follow-up examination after completed treatment for conditions other than malignant neoplasm: Secondary | ICD-10-CM

## 2018-02-04 IMAGING — CR DG CHEST 2V
2 series · 2 of 2 positions shown · non-contrast
Comparison: PA and lateral chest 12/15/2017, 12/09/2017 and
08/29/2016. PET CT scan 08/03/2014.

CLINICAL DATA: Recent diagnosis of pneumonia.

EXAM:
CHEST - 2 VIEW

[w chest pa]
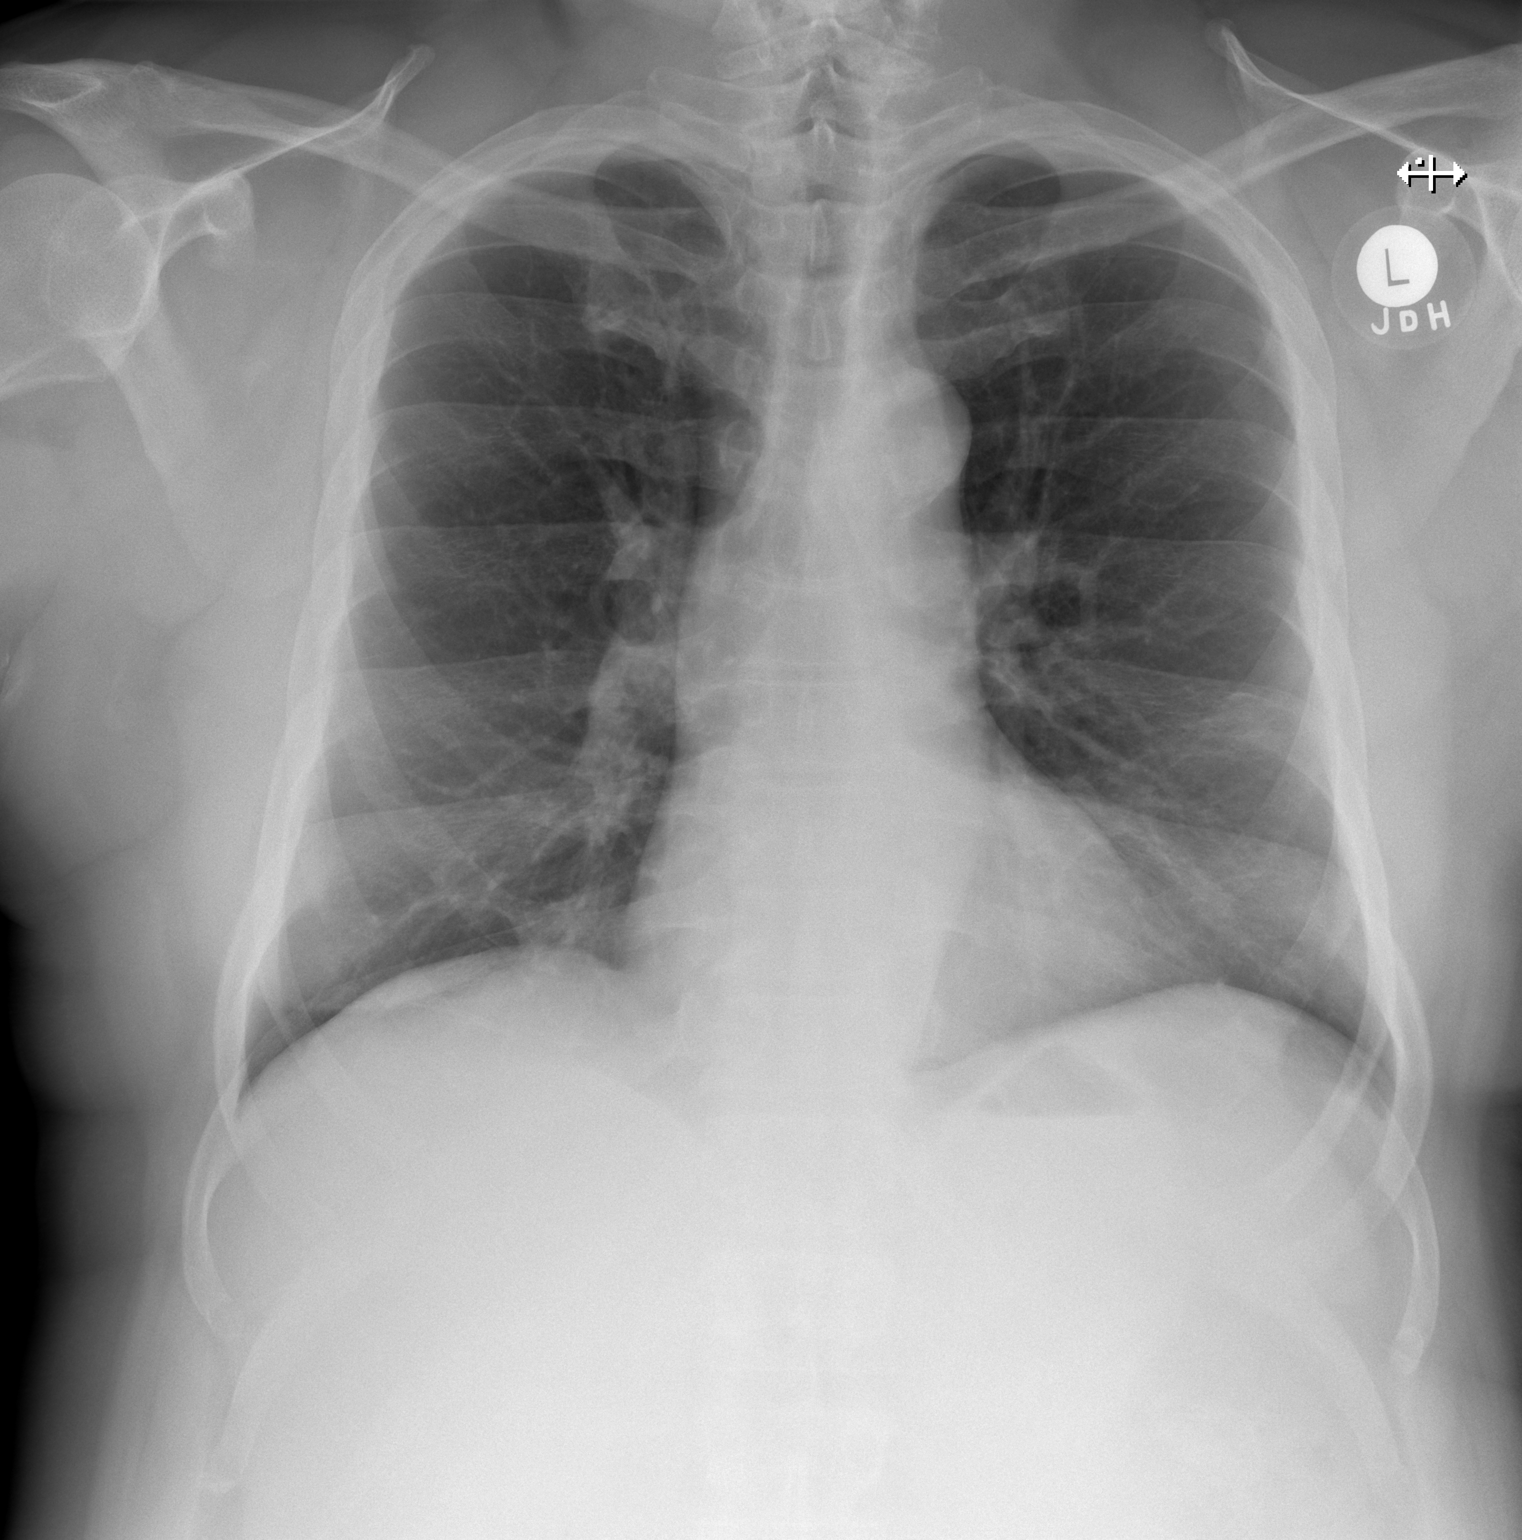

[w chest lat]
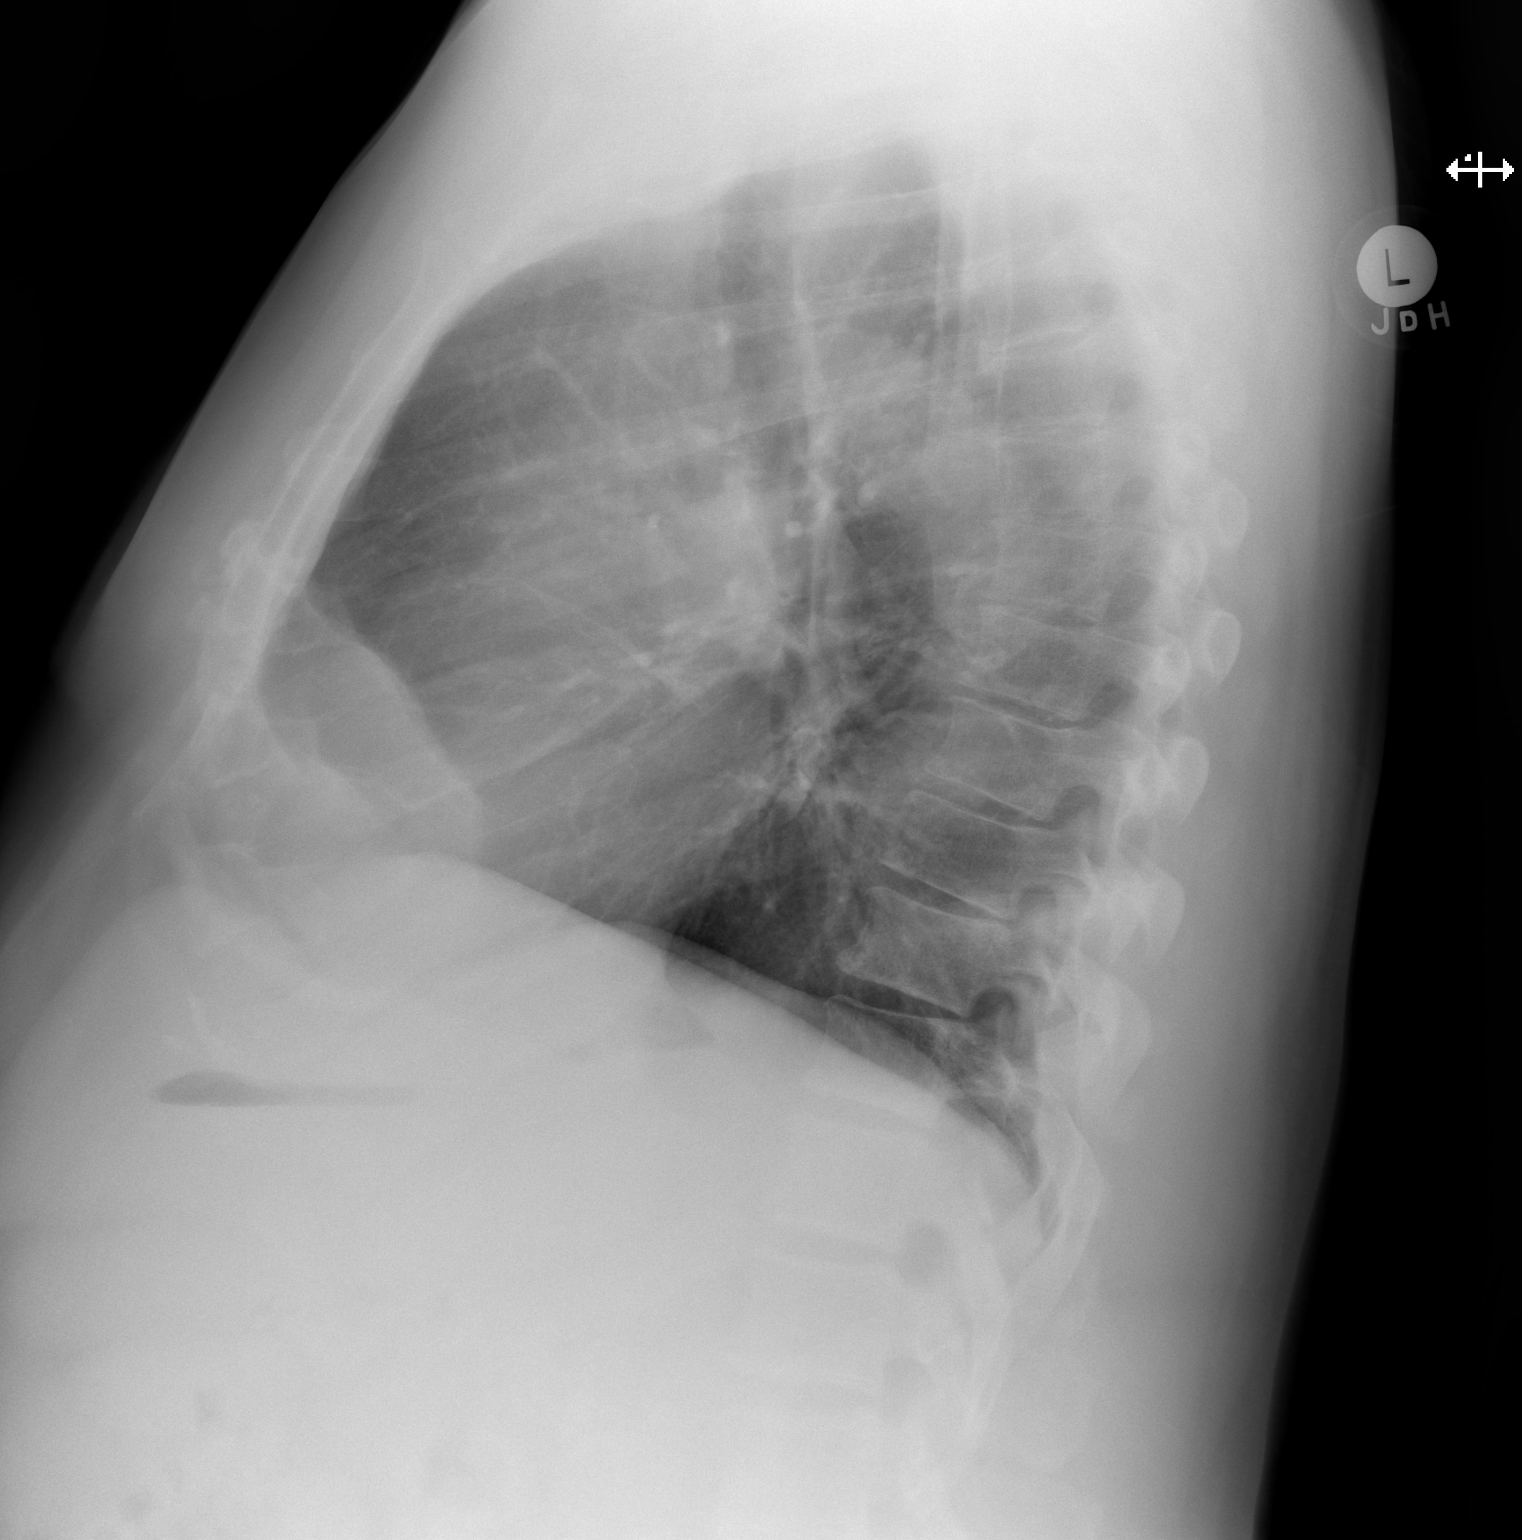

[2 of 2 positions shown; findings below may reference images not displayed]

FINDINGS: The lungs are clear. Heart size is normal. No pneumothorax or
pleural effusion. No bony abnormality.
IMPRESSION: Negative chest.

## 2020-09-12 DIAGNOSIS — G4733 Obstructive sleep apnea (adult) (pediatric): Secondary | ICD-10-CM | POA: Diagnosis not present

## 2020-09-16 DIAGNOSIS — G4733 Obstructive sleep apnea (adult) (pediatric): Secondary | ICD-10-CM | POA: Diagnosis not present

## 2020-09-21 ENCOUNTER — Other Ambulatory Visit: Payer: Self-pay | Admitting: Urology

## 2020-09-21 DIAGNOSIS — C61 Malignant neoplasm of prostate: Secondary | ICD-10-CM

## 2020-09-26 DIAGNOSIS — G4733 Obstructive sleep apnea (adult) (pediatric): Secondary | ICD-10-CM | POA: Diagnosis not present

## 2020-10-13 DIAGNOSIS — G4733 Obstructive sleep apnea (adult) (pediatric): Secondary | ICD-10-CM | POA: Diagnosis not present

## 2020-10-17 ENCOUNTER — Ambulatory Visit
Admission: RE | Admit: 2020-10-17 | Discharge: 2020-10-17 | Disposition: A | Discharge status: Home / Self Care | Payer: BC Managed Care – PPO | Source: Ambulatory Visit | Attending: Urology | Admitting: Urology

## 2020-10-17 ENCOUNTER — Other Ambulatory Visit: Payer: Self-pay

## 2020-10-17 DIAGNOSIS — C61 Malignant neoplasm of prostate: Secondary | ICD-10-CM

## 2020-10-17 DIAGNOSIS — E041 Nontoxic single thyroid nodule: Secondary | ICD-10-CM | POA: Diagnosis not present

## 2020-10-17 IMAGING — MR MR PROSTATE WO/W CM
12 series · 48 of 48 positions shown · IV contrast (multihance)
Comparison: 02/28/2014

CLINICAL DATA: Prostate carcinoma, Gleason score 3+3=6 on previous
TURP. Active surveillance.

EXAM:
MR PROSTATE WITHOUT AND WITH CONTRAST
TECHNIQUE: Multiplanar multisequence MRI images were obtained of the pelvis
centered about the prostate. Pre and post contrast images were
obtained.
CONTRAST:  20mL MULTIHANCE GADOBENATE DIMEGLUMINE 529 MG/ML IV SOLN

[Series 3: T2 · coronal · 3.0mm · 0.56mm/px · 1 of 23 slices shown (1 of 3)]
[im 1/23]
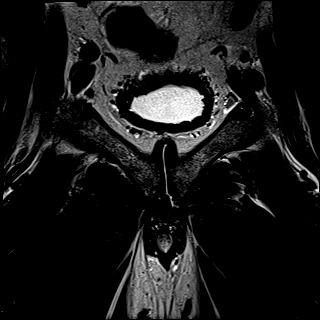

[Series 4: T1 · axial · 5.0mm · 1.25mm/px · z∈[-18,+197]mm · 2 of 88 slices shown]
[im 1/88]
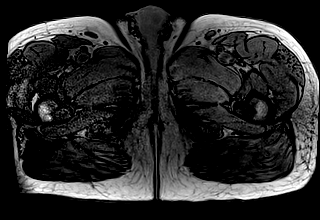
[im 88/88]
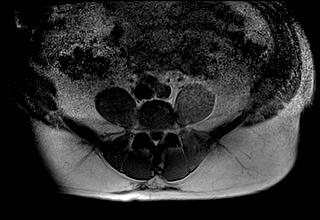

[Series 5: DWI · axial · 3.0mm · 1.75mm/px · 1 of 69 slices shown (1 of 3)]
[im 1/69]
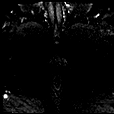

[Series 6: DWI · axial · 3.0mm · 1.75mm/px · 1 of 23 slices shown (2 of 3)]
[im 1/23]
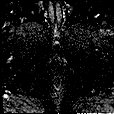

[Series 7: DWI · axial · 3.0mm · 1.75mm/px · 1 of 23 slices shown (3 of 3)]
[im 1/23]
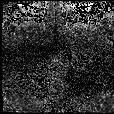

[Series 8: T2 · axial · 3.0mm · 0.56mm/px · 1 of 23 slices shown (2 of 3)]
[im 1/23]
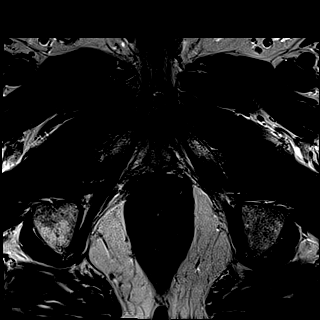

[Series 9: T2 · axial · 1.0mm · 1.04mm/px · z∈[+9,+72]mm · 2 of 64 slices shown (3 of 3)]
[im 1/64]
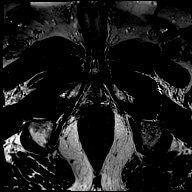
[im 64/64]
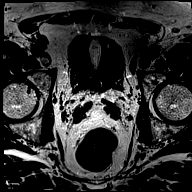

[Series 10: pre t1_twist_tra_dyn · axial · non-contrast · 3.5mm · 0.83mm/px · 1 of 20 slices shown]
[im 1/20]
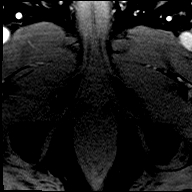

[Series 11: post t1_twist_tra_dyn-copy center · axial · non-contrast · 3.5mm · 0.83mm/px · z∈[+8,+75]mm · 17 of 600 slices shown]
[im 1/600]
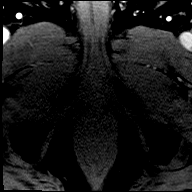
[im 38/600]
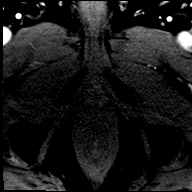
[im 75/600]
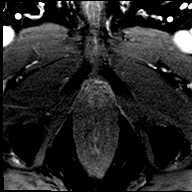
[im 113/600]
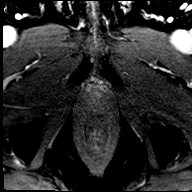
[im 150/600]
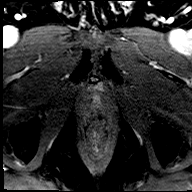
[im 188/600]
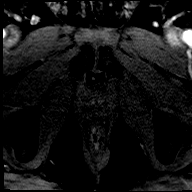
[im 225/600]
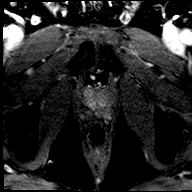
[im 263/600]
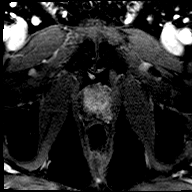
[im 300/600]
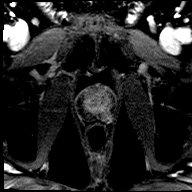
[im 337/600]
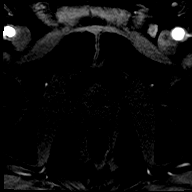
[im 375/600]
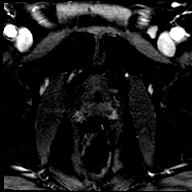
[im 412/600]
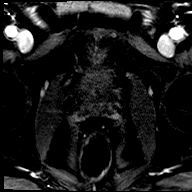
[im 450/600]
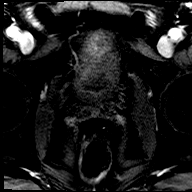
[im 487/600]
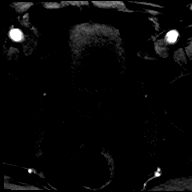
[im 525/600]
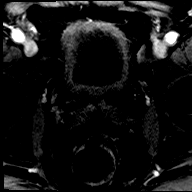
[im 562/600]
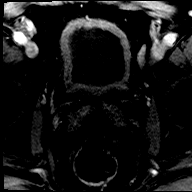
[im 600/600]
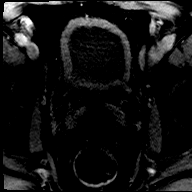

[Series 12: post t1_twist_tra_dyn-copy cent_sub · axial · 3.5mm · 0.83mm/px · z∈[+8,+75]mm · 17 of 580 slices shown]
[im 1/580]
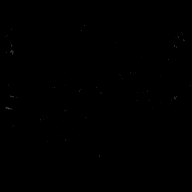
[im 37/580]
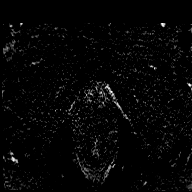
[im 73/580]
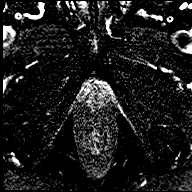
[im 109/580]
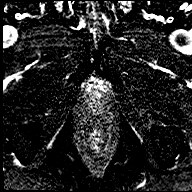
[im 145/580]
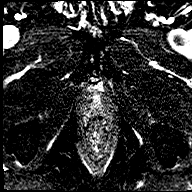
[im 181/580]
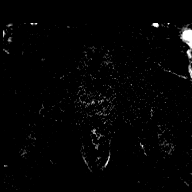
[im 218/580]
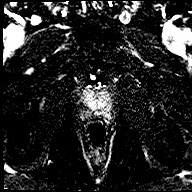
[im 254/580]
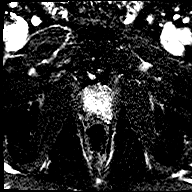
[im 290/580]
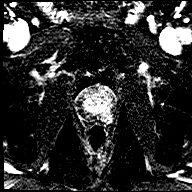
[im 326/580]
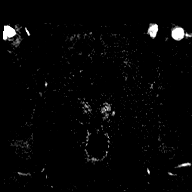
[im 362/580]
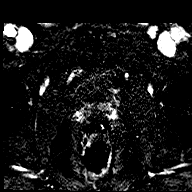
[im 399/580]
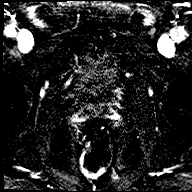
[im 435/580]
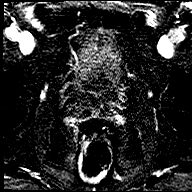
[im 471/580]
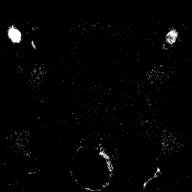
[im 507/580]
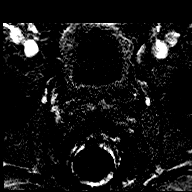
[im 543/580]
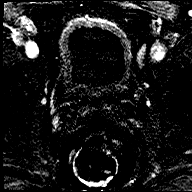
[im 580/580]
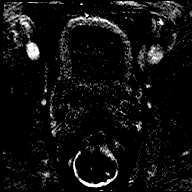

[Series 13: t1_vibe_dixon_tra_f · axial · 2.5mm · 0.91mm/px · z∈[-7,+190]mm · 2 of 80 slices shown]
[im 1/80]
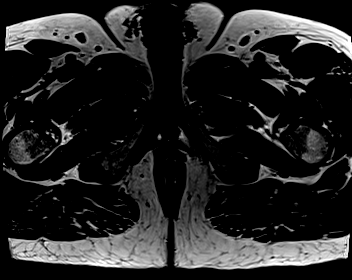
[im 80/80]
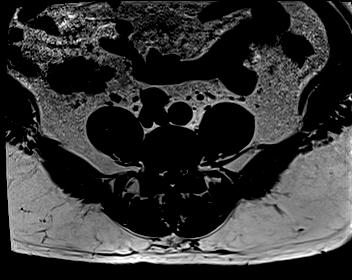

[Series 14: t1_vibe_dixon_tra_w · axial · 2.5mm · 0.91mm/px · z∈[-7,+190]mm · 2 of 80 slices shown]
[im 1/80]
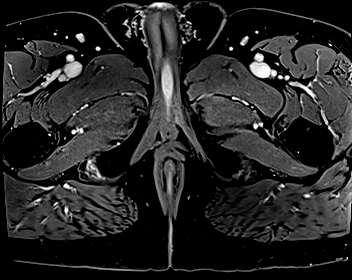
[im 80/80]
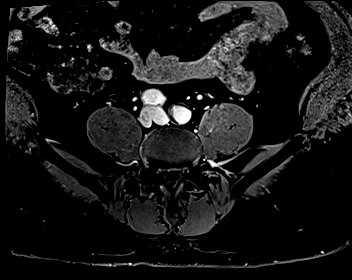

[48 of 48 positions shown; findings below may reference images not displayed]

FINDINGS: Prostate:

-- Peripheral Zone: An 8 x 6 mm T2 hypointense nodule is now seen in
the left posterolateral mid gland, which shows marked ADC
hypointensity, DWI hyperintensity, and early focal contrast
enhancement. No other suspicious peripheral zone nodules identified.

-- Transition/Central Zone: Prior TURP defect again noted. No
suspicious nodules with obscured or non-circumscribed margins seen.

-- Measurements/Volume:  2.8 x 3.1 by 3.4 cm (volume = 15 cm^3)

Transcapsular spread:  Absent

Seminal vesicle involvement:  Absent

Neurovascular bundle involvement:  Absent

Pelvic adenopathy: None visualized

Bone metastasis: None visualized

Other:  Sigmoid diverticulosis, without evidence of diverticulitis.
IMPRESSION: 8 mm peripheral zone nodule in the left posterolateral mid gland,
suspicious for high-grade carcinoma. PI-RADS 4: High (clinically
significant cancer is likely to be present)

No evidence of extracapsular extension or pelvic metastatic disease.

(I have processed this exam in the [employer] application for MR/US
fusion-guided biopsy if performed.)

## 2020-10-17 MED ORDER — GADOBENATE DIMEGLUMINE 529 MG/ML IV SOLN
20.0000 mL | Freq: Once | INTRAVENOUS | Status: AC | PRN
  Administered 2020-10-17: 20 mL via INTRAVENOUS

## 2020-10-26 DIAGNOSIS — N138 Other obstructive and reflux uropathy: Secondary | ICD-10-CM | POA: Diagnosis not present

## 2020-11-01 DIAGNOSIS — G4733 Obstructive sleep apnea (adult) (pediatric): Secondary | ICD-10-CM | POA: Diagnosis not present

## 2020-11-02 DIAGNOSIS — Z87891 Personal history of nicotine dependence: Secondary | ICD-10-CM | POA: Diagnosis not present

## 2020-11-02 DIAGNOSIS — E119 Type 2 diabetes mellitus without complications: Secondary | ICD-10-CM | POA: Diagnosis not present

## 2020-11-10 DIAGNOSIS — G4733 Obstructive sleep apnea (adult) (pediatric): Secondary | ICD-10-CM | POA: Diagnosis not present

## 2020-12-11 DIAGNOSIS — G4733 Obstructive sleep apnea (adult) (pediatric): Secondary | ICD-10-CM | POA: Diagnosis not present

## 2020-12-25 DIAGNOSIS — I10 Essential (primary) hypertension: Secondary | ICD-10-CM | POA: Diagnosis not present

## 2020-12-25 DIAGNOSIS — E119 Type 2 diabetes mellitus without complications: Secondary | ICD-10-CM | POA: Diagnosis not present

## 2020-12-25 DIAGNOSIS — Z87891 Personal history of nicotine dependence: Secondary | ICD-10-CM | POA: Diagnosis not present

## 2020-12-25 DIAGNOSIS — C329 Malignant neoplasm of larynx, unspecified: Secondary | ICD-10-CM | POA: Diagnosis not present

## 2020-12-26 DIAGNOSIS — G4733 Obstructive sleep apnea (adult) (pediatric): Secondary | ICD-10-CM | POA: Diagnosis not present

## 2021-01-05 DIAGNOSIS — Z510 Encounter for antineoplastic radiation therapy: Secondary | ICD-10-CM | POA: Diagnosis not present

## 2021-01-05 DIAGNOSIS — Z888 Allergy status to other drugs, medicaments and biological substances status: Secondary | ICD-10-CM | POA: Diagnosis not present

## 2021-01-10 DIAGNOSIS — N138 Other obstructive and reflux uropathy: Secondary | ICD-10-CM | POA: Diagnosis not present

## 2021-01-10 DIAGNOSIS — G4733 Obstructive sleep apnea (adult) (pediatric): Secondary | ICD-10-CM | POA: Diagnosis not present

## 2021-01-12 DIAGNOSIS — I10 Essential (primary) hypertension: Secondary | ICD-10-CM | POA: Diagnosis not present

## 2021-01-12 DIAGNOSIS — G4733 Obstructive sleep apnea (adult) (pediatric): Secondary | ICD-10-CM | POA: Diagnosis not present

## 2021-01-12 DIAGNOSIS — N183 Chronic kidney disease, stage 3 unspecified: Secondary | ICD-10-CM | POA: Diagnosis not present

## 2021-01-12 DIAGNOSIS — K219 Gastro-esophageal reflux disease without esophagitis: Secondary | ICD-10-CM | POA: Diagnosis not present

## 2021-01-12 DIAGNOSIS — E119 Type 2 diabetes mellitus without complications: Secondary | ICD-10-CM | POA: Diagnosis not present

## 2021-01-12 DIAGNOSIS — E7800 Pure hypercholesterolemia, unspecified: Secondary | ICD-10-CM | POA: Diagnosis not present

## 2021-02-01 DIAGNOSIS — C3411 Malignant neoplasm of upper lobe, right bronchus or lung: Secondary | ICD-10-CM | POA: Diagnosis not present

## 2021-02-15 DIAGNOSIS — R232 Flushing: Secondary | ICD-10-CM | POA: Diagnosis not present

## 2021-02-15 DIAGNOSIS — C3411 Malignant neoplasm of upper lobe, right bronchus or lung: Secondary | ICD-10-CM | POA: Diagnosis not present

## 2021-02-15 DIAGNOSIS — N138 Other obstructive and reflux uropathy: Secondary | ICD-10-CM | POA: Diagnosis not present

## 2021-02-20 DIAGNOSIS — C3411 Malignant neoplasm of upper lobe, right bronchus or lung: Secondary | ICD-10-CM | POA: Diagnosis not present

## 2021-02-20 DIAGNOSIS — Z510 Encounter for antineoplastic radiation therapy: Secondary | ICD-10-CM | POA: Diagnosis not present

## 2021-02-21 DIAGNOSIS — C3411 Malignant neoplasm of upper lobe, right bronchus or lung: Secondary | ICD-10-CM | POA: Diagnosis not present

## 2021-02-21 DIAGNOSIS — Z510 Encounter for antineoplastic radiation therapy: Secondary | ICD-10-CM | POA: Diagnosis not present

## 2021-02-22 DIAGNOSIS — C3411 Malignant neoplasm of upper lobe, right bronchus or lung: Secondary | ICD-10-CM | POA: Diagnosis not present

## 2021-02-22 DIAGNOSIS — Z510 Encounter for antineoplastic radiation therapy: Secondary | ICD-10-CM | POA: Diagnosis not present

## 2021-02-23 DIAGNOSIS — C3411 Malignant neoplasm of upper lobe, right bronchus or lung: Secondary | ICD-10-CM | POA: Diagnosis not present

## 2021-02-23 DIAGNOSIS — Z510 Encounter for antineoplastic radiation therapy: Secondary | ICD-10-CM | POA: Diagnosis not present

## 2021-02-26 DIAGNOSIS — C3411 Malignant neoplasm of upper lobe, right bronchus or lung: Secondary | ICD-10-CM | POA: Diagnosis not present

## 2021-02-26 DIAGNOSIS — Z510 Encounter for antineoplastic radiation therapy: Secondary | ICD-10-CM | POA: Diagnosis not present

## 2021-02-27 DIAGNOSIS — Z510 Encounter for antineoplastic radiation therapy: Secondary | ICD-10-CM | POA: Diagnosis not present

## 2021-02-27 DIAGNOSIS — C3411 Malignant neoplasm of upper lobe, right bronchus or lung: Secondary | ICD-10-CM | POA: Diagnosis not present

## 2021-02-28 DIAGNOSIS — C3411 Malignant neoplasm of upper lobe, right bronchus or lung: Secondary | ICD-10-CM | POA: Diagnosis not present

## 2021-02-28 DIAGNOSIS — Z510 Encounter for antineoplastic radiation therapy: Secondary | ICD-10-CM | POA: Diagnosis not present

## 2021-03-01 DIAGNOSIS — C3411 Malignant neoplasm of upper lobe, right bronchus or lung: Secondary | ICD-10-CM | POA: Diagnosis not present

## 2021-03-01 DIAGNOSIS — Z510 Encounter for antineoplastic radiation therapy: Secondary | ICD-10-CM | POA: Diagnosis not present

## 2021-03-02 DIAGNOSIS — Z510 Encounter for antineoplastic radiation therapy: Secondary | ICD-10-CM | POA: Diagnosis not present

## 2021-03-02 DIAGNOSIS — C3411 Malignant neoplasm of upper lobe, right bronchus or lung: Secondary | ICD-10-CM | POA: Diagnosis not present

## 2021-03-05 DIAGNOSIS — Z510 Encounter for antineoplastic radiation therapy: Secondary | ICD-10-CM | POA: Diagnosis not present

## 2021-03-05 DIAGNOSIS — C3411 Malignant neoplasm of upper lobe, right bronchus or lung: Secondary | ICD-10-CM | POA: Diagnosis not present

## 2021-03-06 DIAGNOSIS — C3411 Malignant neoplasm of upper lobe, right bronchus or lung: Secondary | ICD-10-CM | POA: Diagnosis not present

## 2021-03-06 DIAGNOSIS — Z510 Encounter for antineoplastic radiation therapy: Secondary | ICD-10-CM | POA: Diagnosis not present

## 2021-03-07 DIAGNOSIS — C3411 Malignant neoplasm of upper lobe, right bronchus or lung: Secondary | ICD-10-CM | POA: Diagnosis not present

## 2021-03-07 DIAGNOSIS — Z510 Encounter for antineoplastic radiation therapy: Secondary | ICD-10-CM | POA: Diagnosis not present

## 2021-03-08 DIAGNOSIS — C3411 Malignant neoplasm of upper lobe, right bronchus or lung: Secondary | ICD-10-CM | POA: Diagnosis not present

## 2021-03-08 DIAGNOSIS — Z510 Encounter for antineoplastic radiation therapy: Secondary | ICD-10-CM | POA: Diagnosis not present

## 2021-03-09 DIAGNOSIS — C3411 Malignant neoplasm of upper lobe, right bronchus or lung: Secondary | ICD-10-CM | POA: Diagnosis not present

## 2021-03-09 DIAGNOSIS — Z510 Encounter for antineoplastic radiation therapy: Secondary | ICD-10-CM | POA: Diagnosis not present

## 2021-03-12 DIAGNOSIS — C3411 Malignant neoplasm of upper lobe, right bronchus or lung: Secondary | ICD-10-CM | POA: Diagnosis not present

## 2021-03-12 DIAGNOSIS — Z510 Encounter for antineoplastic radiation therapy: Secondary | ICD-10-CM | POA: Diagnosis not present

## 2021-03-13 DIAGNOSIS — C3411 Malignant neoplasm of upper lobe, right bronchus or lung: Secondary | ICD-10-CM | POA: Diagnosis not present

## 2021-03-13 DIAGNOSIS — Z510 Encounter for antineoplastic radiation therapy: Secondary | ICD-10-CM | POA: Diagnosis not present

## 2021-03-14 DIAGNOSIS — C3411 Malignant neoplasm of upper lobe, right bronchus or lung: Secondary | ICD-10-CM | POA: Diagnosis not present

## 2021-03-14 DIAGNOSIS — Z510 Encounter for antineoplastic radiation therapy: Secondary | ICD-10-CM | POA: Diagnosis not present

## 2021-03-16 DIAGNOSIS — Z510 Encounter for antineoplastic radiation therapy: Secondary | ICD-10-CM | POA: Diagnosis not present

## 2021-03-16 DIAGNOSIS — C3411 Malignant neoplasm of upper lobe, right bronchus or lung: Secondary | ICD-10-CM | POA: Diagnosis not present

## 2021-03-20 DIAGNOSIS — C3411 Malignant neoplasm of upper lobe, right bronchus or lung: Secondary | ICD-10-CM | POA: Diagnosis not present

## 2021-03-20 DIAGNOSIS — Z510 Encounter for antineoplastic radiation therapy: Secondary | ICD-10-CM | POA: Diagnosis not present

## 2021-03-21 DIAGNOSIS — C3411 Malignant neoplasm of upper lobe, right bronchus or lung: Secondary | ICD-10-CM | POA: Diagnosis not present

## 2021-03-21 DIAGNOSIS — Z510 Encounter for antineoplastic radiation therapy: Secondary | ICD-10-CM | POA: Diagnosis not present

## 2021-03-27 DIAGNOSIS — Z510 Encounter for antineoplastic radiation therapy: Secondary | ICD-10-CM | POA: Diagnosis not present

## 2021-03-27 DIAGNOSIS — C3411 Malignant neoplasm of upper lobe, right bronchus or lung: Secondary | ICD-10-CM | POA: Diagnosis not present

## 2021-03-28 DIAGNOSIS — Z510 Encounter for antineoplastic radiation therapy: Secondary | ICD-10-CM | POA: Diagnosis not present

## 2021-03-28 DIAGNOSIS — C3411 Malignant neoplasm of upper lobe, right bronchus or lung: Secondary | ICD-10-CM | POA: Diagnosis not present

## 2021-03-29 DIAGNOSIS — C3411 Malignant neoplasm of upper lobe, right bronchus or lung: Secondary | ICD-10-CM | POA: Diagnosis not present

## 2021-03-29 DIAGNOSIS — Z510 Encounter for antineoplastic radiation therapy: Secondary | ICD-10-CM | POA: Diagnosis not present

## 2021-07-13 DIAGNOSIS — G4733 Obstructive sleep apnea (adult) (pediatric): Secondary | ICD-10-CM | POA: Diagnosis not present

## 2021-10-11 DIAGNOSIS — G4733 Obstructive sleep apnea (adult) (pediatric): Secondary | ICD-10-CM | POA: Diagnosis not present

## 2022-01-09 DIAGNOSIS — G4733 Obstructive sleep apnea (adult) (pediatric): Secondary | ICD-10-CM | POA: Diagnosis not present

## 2022-04-18 DIAGNOSIS — G4733 Obstructive sleep apnea (adult) (pediatric): Secondary | ICD-10-CM | POA: Diagnosis not present

## 2022-10-05 DIAGNOSIS — J4521 Mild intermittent asthma with (acute) exacerbation: Secondary | ICD-10-CM | POA: Diagnosis not present

## 2022-11-06 DIAGNOSIS — G4733 Obstructive sleep apnea (adult) (pediatric): Secondary | ICD-10-CM | POA: Diagnosis not present

## 2022-11-25 DIAGNOSIS — G4733 Obstructive sleep apnea (adult) (pediatric): Secondary | ICD-10-CM | POA: Diagnosis not present

## 2023-04-28 DIAGNOSIS — Z20822 Contact with and (suspected) exposure to covid-19: Secondary | ICD-10-CM | POA: Diagnosis not present

## 2023-04-28 DIAGNOSIS — J40 Bronchitis, not specified as acute or chronic: Secondary | ICD-10-CM | POA: Diagnosis not present

## 2024-01-08 NOTE — Progress Notes (Signed)
 John CHARLENA Barter, MD [facility] [address] [place], [place], [address] P. [phone]   Date of Service:01/08/24    Assessment: Prostate cancer  BPH with LUTS s/p TURP ED   Plan: Continue tamsulosin  Increase tadalafil from 5 mg to 20 mg as needed RTC 6 months with PSA   I have personally spent 25 minutes involved in face-to-face and non-face-to-face activities for this patient on the day of the visit.  Professional time spent includes the following activities, in addition to those noted in the documentation: face-to-face obtaining history and performing examination, review of office notes from other physicians or providers, personally reviewing labs, imaging, counseling and educating the patient/family, ordering tests/treatments/procedures, care coordination, and/or documentation of the visit.  PCP:  John JONELLE Lesches, MD  CC: f/u prostate cancer  HPI:  73 y.o. male with history of unfavorable intermediate risk Gleason 7 prostate cancer s/p 6 months of ADT, BPH s/p TURP 2015, ED who presents for follow-up   Last seen 07/09/2023 at which time PSA was 0.18  Most recent PSA 0.14  Denies bothersome LUTS or GH   He has history of VED, difficulty attaining and maintaining erections which started prior to his TURP.  Previously tried Viagra as needed many years ago.  At last visit was given trial of tadalafil 5 mg daily, he said it helped somewhat but still having difficulty  Prostate Cancer History: Gleason 3+3=6 cT1b disease on TURBT 10/2013 --> active surveillance s/p fusion guided biopsy (4/22) Gleason 4+3=7 prostate cancer, 5/15 cores,  ADT started 5/22 for one month.  Last 79-month Lupron given in 6/22 Plan has been for 6 months of ADT PSA at diagnosis: 2.60   IPSS:did not fill out  LAB RESULTS REVIEWED: Most recent serum creatinine level: Lab Results  Component Value Date/Time   CREATININE 1.29 11/02/2020 10:06 AM     PSA levels: No components found for: PSAG  Serum testosterone levels: No components found for: TESTR No components found for: FTT   PMH:  Past Medical History:  Diagnosis Date   Anterior glottic web    Benign essential hypertension    BPH with obstruction/lower urinary tract symptoms    Carcinoma of vocal cord    (CMD)    Chronic subglottic laryngitis    ED (erectile dysfunction) of organic origin    Extrinsic asthma without complication (CMD)    GERD without esophagitis    Hearing loss    History of radiation to head and neck region    Hyperlipidemia    Hypersomnia    Laryngeal carcinoma    (CMD)    T3 s/p Chemotherapy and Radiation Therapy (2015)   Malignant neoplasm of prostate    (CMD)    Malignant tumor of glottis    (CMD)    Myocardial infarction    (CMD)    Obesity due to excess calories with serious comorbidity    Obstructive sleep apnea    Primary osteoarthritis involving multiple joints    Pure hypercholesterolemia    Simple chronic bronchitis    (CMD)    Slow transit constipation    Stage 3 chronic kidney disease (CMD)    Type 2 diabetes mellitus with hyperglycemia    (CMD)    Unilateral vocal cord paralysis      PSH: Past Surgical History:  Procedure Laterality Date   ACHILLES TENDON SURGERY Left    Procedure: ACHILLES TENDON REPAIR   PROSTATE BIOPSY     Procedure:  PROSTATE BIOPSY       Medications: Current Outpatient Medications  Medication Sig Dispense Refill   albuterol 2.5 mg /3 mL (0.083 %) nebulizer solution Take 2.5 mg by nebulization every 4 (four) hours as needed for wheezing or shortness of breath. 60 vial 1   albuterol HFA (PROVENTIL HFA;VENTOLIN HFA;PROAIR HFA) 90 mcg/actuation inhaler INHALE 2 PUFFS AS NEEDED EVERY 6 HRS INHALATION     amLODIPine (NORVASC) 5 mg tablet   3   atorvastatin (LIPITOR) 20 mg tablet   10   cloNIDine (CATAPRES) 0.1 mg tablet TAKE 1 TABLET BY MOUTH EVERYDAY AT  BEDTIME 90 tablet 1   furosemide (LASIX) 20 mg tablet TAKE 1 TABLET BY MOUTH EVERY MORNING AND AGAIN IN THE AFTERNOON AS NEEDED FOR SWELLING  6   hydroCHLOROthiazide (HYDRODIURIL) 25 mg tablet TAKE 1 TABLET BY MOUTH EVERY MORNING AS NEEDED FOR SWELLING  5   levalbuterol (XOPENEX) 1.25 mg/3 mL nebulizer solution INHALE 3 ML BY NEBULIZATION EVERY 8 HOURS AS NEEDED FOR 30 DAYS     metFORMIN (GLUCOPHAGE-XR) 500 mg 24 hr tablet TAKE 1 TABLET BY MOUTH EVERY DAY IN THE EVENING WITH FOOD  6   metoprolol tartrate (LOPRESSOR) 25 mg tablet   5   pantoprazole (PROTONIX) 40 mg EC tablet      promethazine-dextromethorphan (PHENERGAN DM) 6.25-15 mg/5 mL syrp syrup Take 5 mL by mouth every 4 (four) hours. 180 mL 0   tadalafiL (CIALIS) 5 mg tablet Take 1 tablet (5 mg total) by mouth daily. 90 tablet 3   tamsulosin (FLOMAX) 0.4 mg cap   6   tiZANidine (ZANAFLEX) 2 mg tablet 1 TABLET AS NEEDED FOR MUSCLE SPASM ORALLY TWO TIMES A DAY 30 DAYS     diclofenac (VOLTAREN) 75 mg EC tablet Take 75 mg by mouth 2 (two) times a day for 60 days. 60 tablet 1   No current facility-administered medications for this visit.    Allergies: Lisinopril   Social History: Patient  reports that he quit smoking about 15 years ago. His smoking use included cigarettes. He started smoking about 49 years ago. He has a 34 pack-year smoking history. He has never used smokeless tobacco. He reports that he does not drink alcohol and does not use drugs.   Family History: The patient's family history includes Cancer in his mother; Heart disease in an other family member; Hyperlipidemia in an other family member; Hypertension in an other family member.  Review of Systems: A complete review of systems was obtained including: Constitutional, Eyes, ENT, Cardiovascular, Respiratory, GI, GU, Musculoskeletal, Skin, Neurological, Psychiatric, Endocrine, Heme/Lymphatic, and Allergic/Immunologic systems. It is negative or non-contributory to  the patients management except for as stated in this note.  Physical Exam: BP 127/79   Pulse 60   Temp 97 F (36.1 C) (Oral)   Ht 1.829 m (6')   Wt 105 kg (231 lb)   BMI 31.33 kg/m  GENERAL APPEARANCE:  Well appearing, well developed, well nourished, NAD HEENT: Atraumatic, Normocephalic LUNGS: Normal  respiratory effort on room air HEART: no obvious cyanosis EXTREMITIES: Moves all extremities well.  Without edema. NEUROLOGIC:  Alert and oriented x 3, CN II-XII grossly intact.  MENTAL STATUS:  Appropriate. SKIN:  Warm, dry and intact.    Results:   Urinalysis and laboratory results, if present, reviewed and analyzed. Recent Results (from the past 24 hours)  Urinalysis with Reflex to Microscopic   Collection Time: 01/08/24 10:10 AM  Result Value Ref Range   Color, Urine  Yellow Yellow   Clarity, Urine Clear Clear   Specific Gravity, Urine 1.025 1.002 - 1.030   pH, Urine 6.0 5.0 - 8.0   Protein, Urine 1+ (A) Negative, Trace mg/dL   Glucose, Urine Negative Negative mg/dL   Ketones, Urine Negative Negative mg/dL   Bilirubin, Urine Negative Negative   Blood, Urine Negative Negative   Nitrite, Urine Negative Negative   Leukocyte Esterase, Urine Negative Negative   Urobilinogen, Urine 0.2 <2.0 mg/dL  Urinalysis, Microscopic Only   Collection Time: 01/08/24 10:10 AM  Result Value Ref Range   WBC, Urine 3-8 (A) <3 /HPF   RBC, Urine None Seen <3 /HPF   Bacteria, Urine None Seen None Seen /HPF     Proper PPE worn during the visit.  Thank you very much for allowing me to participate in the care of this patient.

## 2024-08-09 NOTE — Progress Notes (Signed)
 John CHARLENA Barter, MD [facility] [address] [place], [place], [address] P. [phone]   Date of Service:08/11/2024    Assessment: Prostate cancer  BPH with LUTS s/p TURP ED   Plan: Continue tamsulosin  Disontinue tadalafil. Start sildenafil 100 mg as needed for ED.  RTC 6 months with PSA    I have personally spent 25 minutes involved in face-to-face and non-face-to-face activities for this patient on the day of the visit.  Professional time spent includes the following activities, in addition to those noted in the documentation: face-to-face obtaining history and performing examination, review of office notes from other physicians or providers, reviewed outside records, labs, imaging, counseling and educating the patient/family, ordering tests/treatments/procedures, care coordination, and/or documentation of the visit.  PCP:  [name], MD  CC: f/u prostate cancer   HPI:  73 y.o. male with history of unfavorable intermediate risk Gleason 7 prostate cancer s/p 6 months of ADT, BPH s/p TURP 2015, ED who presents for follow-up   Last seen 01/08/2024 at which time PSA was 0.14 and tadalafil 5 mg was increased to 20 mg as needed for ED  Most recent PSA was 0.29 PSA doubling time:  17 months  Denies bothersome LUTS or GH    He has history of ED, difficulty attaining and maintaining erections which started prior to his TURP.   Currently taking tadalafil 20 mg daily with no improvement. Previously tried Viagra as needed many years ago, and would like to trial this again.   Prostate Cancer History: Gleason 3+3=6 cT1b disease on TURBT 10/2013 --> active surveillance s/p fusion guided biopsy (4/22) Gleason 4+3=7 prostate cancer, 5/15 cores,  ADT started 5/22 for one month.  Last 3-month Lupron given in 6/22 Plan has been for 6 months of ADT PSA at diagnosis: 2.60  IPSS:7/0  LAB RESULTS REVIEWED: Most recent serum creatinine  level: Lab Results  Component Value Date/Time   CREATININE 1.29 11/02/2020 10:06 AM    PSA levels: No components found for: PSAG  Serum testosterone levels: No components found for: TESTR No components found for: FTT   PMH:  Medical History[1]   PSH: Surgical History[2]     Medications: Current Medications[3]  Allergies: Lisinopril   Social History: Patient  reports that he quit smoking about 15 years ago. His smoking use included cigarettes. He started smoking about 50 years ago. He has a 34 pack-year smoking history. He has never used smokeless tobacco. He reports that he does not drink alcohol and does not use drugs.   Family History: The patient's family history includes Cancer in his mother; Heart disease in an other family member; Hyperlipidemia in an other family member; Hypertension in an other family member.  Review of Systems: A complete review of systems was obtained including: Constitutional, Eyes, ENT, Cardiovascular, Respiratory, GI, GU, Musculoskeletal, Skin, Neurological, Psychiatric, Endocrine, Heme/Lymphatic, and Allergic/Immunologic systems. It is negative or non-contributory to the patients management except for as stated in this note.  Physical Exam: BP 138/79   Pulse 71   Temp 97.3 F (36.3 C)   Ht 1.829 m (6')   BMI 31.33 kg/m  GENERAL APPEARANCE:  Well appearing, well developed, well nourished, NAD HEENT: Atraumatic, Normocephalic LUNGS: Normal  respiratory effort on room air HEART: no obvious cyanosis EXTREMITIES: Moves all extremities well.  Without edema. NEUROLOGIC:  Alert and oriented x 3, CN II-XII grossly intact.  MENTAL STATUS:  Appropriate. SKIN:  Warm, dry and intact.  Results:   Urinalysis and laboratory results, if present, reviewed and analyzed. Recent Results (from the past 24 hours)  Urinalysis with Reflex to Microscopic   Collection Time: 08/11/24  9:10 AM  Result Value Ref Range   Color, Urine Yellow  Yellow   Clarity, Urine Clear Clear   Specific Gravity, Urine >=1.030 1.002 - 1.030   pH, Urine 5.5 5.0 - 8.0   Protein, Urine 1+ (A) Negative, Trace mg/dL   Glucose, Urine Negative Negative mg/dL   Ketones, Urine Negative Negative mg/dL   Bilirubin, Urine Negative Negative   Blood, Urine Negative Negative   Nitrite, Urine Negative Negative   Leukocyte Esterase, Urine Negative Negative   Urobilinogen, Urine 0.2 <2.0 mg/dL  Urinalysis, Microscopic Only   Collection Time: 08/11/24  9:10 AM  Result Value Ref Range   WBC, Urine 3-8 (A) <3 /HPF   RBC, Urine None Seen <3 /HPF   Bacteria, Urine None Seen None Seen /HPF   Squamous Epithelial Cells, Urine 1-5 (A) None Seen /HPF     Proper PPE worn during the visit.  Thank you very much for allowing me to participate in the care of this patient.              [1] Past Medical History: Diagnosis Date   Anterior glottic web    Benign essential hypertension    BPH with obstruction/lower urinary tract symptoms    Carcinoma of vocal cord    (CMD)    Chronic subglottic laryngitis    ED (erectile dysfunction) of organic origin    Extrinsic asthma without complication (CMD)    GERD without esophagitis    Hearing loss    History of radiation to head and neck region    Hyperlipidemia    Hypersomnia    Laryngeal carcinoma (CMD)    T3 s/p Chemotherapy and Radiation Therapy (2015)   Malignant neoplasm of prostate    (CMD)    Malignant tumor of glottis    (CMD)    Myocardial infarction    (CMD)    Obesity due to excess calories with serious comorbidity    Obstructive sleep apnea    Primary osteoarthritis involving multiple joints    Pure hypercholesterolemia    Simple chronic bronchitis    (CMD)    Slow transit constipation    Stage 3 chronic kidney disease (CMD)    Type 2 diabetes mellitus with hyperglycemia    (CMD)    Unilateral vocal cord paralysis   [2] Past Surgical History: Procedure  Laterality Date   ACHILLES TENDON SURGERY Left    Procedure: ACHILLES TENDON REPAIR   PROSTATE BIOPSY     Procedure: PROSTATE BIOPSY  [3] Current Outpatient Medications  Medication Sig Dispense Refill   albuterol 2.5 mg /3 mL (0.083 %) nebulizer solution Take 2.5 mg by nebulization every 4 (four) hours as needed for wheezing or shortness of breath. 60 vial 1   albuterol HFA (PROVENTIL HFA;VENTOLIN HFA;PROAIR HFA) 90 mcg/actuation inhaler INHALE 2 PUFFS AS NEEDED EVERY 6 HRS INHALATION     amLODIPine (NORVASC) 5 mg tablet   3   atorvastatin (LIPITOR) 20 mg tablet   10   cloNIDine (CATAPRES) 0.1 mg tablet TAKE 1 TABLET BY MOUTH EVERYDAY AT BEDTIME 90 tablet 1   furosemide (LASIX) 20 mg tablet TAKE 1 TABLET BY MOUTH EVERY MORNING AND AGAIN IN THE AFTERNOON AS NEEDED FOR SWELLING  6   hydroCHLOROthiazide (HYDRODIURIL) 25 mg tablet TAKE 1 TABLET BY MOUTH EVERY MORNING  AS NEEDED FOR SWELLING  5   levalbuterol (XOPENEX) 1.25 mg/3 mL nebulizer solution INHALE 3 ML BY NEBULIZATION EVERY 8 HOURS AS NEEDED FOR 30 DAYS     metoprolol tartrate (LOPRESSOR) 25 mg tablet   5   Mounjaro 5 mg/0.5 mL subcutaneous pen injector Inject 5 mg under the skin every 7 days. 30 days     pantoprazole (PROTONIX) 40 mg EC tablet      promethazine-dextromethorphan (PHENERGAN DM) 6.25-15 mg/5 mL syrp syrup Take 5 mL by mouth every 4 (four) hours. 180 mL 0   tadalafiL (CIALIS) 20 mg tablet Take 1 tablet (20 mg total) by mouth daily as needed for erectile dysfunction. 30 tablet 11   tamsulosin (FLOMAX) 0.4 mg cap   6   tiZANidine (ZANAFLEX) 2 mg tablet 1 TABLET AS NEEDED FOR MUSCLE SPASM ORALLY TWO TIMES A DAY 30 DAYS     diclofenac (VOLTAREN) 75 mg EC tablet Take 75 mg by mouth 2 (two) times a day for 60 days. 60 tablet 1   metFORMIN (GLUCOPHAGE-XR) 500 mg 24 hr tablet TAKE 1 TABLET BY MOUTH EVERY DAY IN THE EVENING WITH FOOD  6   No current facility-administered medications for this visit.

## 2024-09-16 ENCOUNTER — Encounter: Payer: Self-pay | Admitting: *Deleted

## 2024-09-16 NOTE — Progress Notes (Signed)
 John Levine                                          MRN: [id number]   09/16/2024   The [facility] Quality Team Specialist reviewed this patient medical record for the purposes of chart review for care gap closure. The following were reviewed: chart review for care gap closure-glycemic status assessment.    [facility] Quality Team
